# Patient Record
Sex: Male | Born: 1995 | Race: White | Hispanic: No | Marital: Single | Smoking: Never smoker
Health system: Southern US, Community
[De-identification: ages and names within clinical notes are randomized; demographics above are authoritative.]

## PROBLEM LIST (undated history)

## (undated) DIAGNOSIS — F209 Schizophrenia, unspecified: Secondary | ICD-10-CM

---
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
## 2018-03-31 ENCOUNTER — Ambulatory Visit (HOSPITAL_COMMUNITY)
Admission: RE | Admit: 2018-03-31 | Discharge: 2018-03-31 | Disposition: A | Discharge status: Home / Self Care | Payer: BLUE CROSS/BLUE SHIELD | Attending: Psychiatry | Admitting: Psychiatry

## 2018-03-31 DIAGNOSIS — F321 Major depressive disorder, single episode, moderate: Secondary | ICD-10-CM | POA: Diagnosis present

## 2018-03-31 NOTE — H&P (Signed)
Behavioral Health Medical Screening Exam  Francisco Murray is an 22 y.o. male.  Total Time spent with patient: 20 minutes  Psychiatric Specialty Exam: Physical Exam  Nursing note and vitals reviewed. Constitutional: He is oriented to person, place, and time. He appears well-developed and well-nourished.  Cardiovascular: Normal rate.  Respiratory: Effort normal.  Musculoskeletal: Normal range of motion.  Neurological: He is alert and oriented to person, place, and time.  Skin: Skin is warm.    Review of Systems  Constitutional: Negative.   HENT: Negative.   Eyes: Negative.   Respiratory: Negative.   Cardiovascular: Negative.   Gastrointestinal: Negative.   Genitourinary: Negative.   Musculoskeletal: Negative.   Skin: Negative.   Neurological: Negative.   Endo/Heme/Allergies: Negative.   Psychiatric/Behavioral: Negative for hallucinations and suicidal ideas.       Agitation and anger    Blood pressure (!) 144/71, pulse 78, resp. rate 16, SpO2 100 %.There is no height or weight on file to calculate BMI.  General Appearance: Casual  Eye Contact:  Good  Speech:  Clear and Coherent and Normal Rate  Volume:  Normal  Mood:  Depressed  Affect:  Flat  Thought Process:  Goal Directed and Descriptions of Associations: Intact  Orientation:  Full (Time, Place, and Person)  Thought Content:  WDL  Suicidal Thoughts:  No  Homicidal Thoughts:  No  Memory:  Immediate;   Good Recent;   Good Remote;   Good  Judgement:  Fair  Insight:  Good  Psychomotor Activity:  Normal  Concentration: Concentration: Good and Attention Span: Good  Recall:  Good  Fund of Knowledge:Good  Language: Good  Akathisia:  No  Handed:  Right  AIMS (if indicated):     Assets:  Communication Skills Desire for Improvement Financial Resources/Insurance Housing Physical Health Social Support Transportation Vocational/Educational  Sleep:       Musculoskeletal: Strength & Muscle Tone: within normal  limits Gait & Station: normal Patient leans: N/A  Blood pressure (!) 144/71, pulse 78, resp. rate 16, SpO2 100 %.  Recommendations: Patient given information for IOP and PHP Based on my evaluation the patient does not appear to have an emergency medical condition.  Gerlene Burdock Pippa Hanif, FNP 03/31/2018, 3:28 PM
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
## 2018-04-04 ENCOUNTER — Encounter (HOSPITAL_COMMUNITY): Payer: Self-pay | Admitting: Emergency Medicine

## 2018-04-04 ENCOUNTER — Emergency Department (HOSPITAL_COMMUNITY): Payer: BLUE CROSS/BLUE SHIELD

## 2018-04-04 ENCOUNTER — Emergency Department (HOSPITAL_COMMUNITY)
Admission: EM | Admit: 2018-04-04 | Discharge: 2018-04-05 | Disposition: A | Discharge status: Other Acute Inpatient Hospital | Payer: BLUE CROSS/BLUE SHIELD | Attending: Emergency Medicine | Admitting: Emergency Medicine

## 2018-04-04 DIAGNOSIS — R462 Strange and inexplicable behavior: Secondary | ICD-10-CM

## 2018-04-04 DIAGNOSIS — F209 Schizophrenia, unspecified: Secondary | ICD-10-CM | POA: Insufficient documentation

## 2018-04-04 DIAGNOSIS — Z008 Encounter for other general examination: Secondary | ICD-10-CM | POA: Diagnosis not present

## 2018-04-04 DIAGNOSIS — R4182 Altered mental status, unspecified: Secondary | ICD-10-CM | POA: Diagnosis present

## 2018-04-04 DIAGNOSIS — R443 Hallucinations, unspecified: Secondary | ICD-10-CM

## 2018-04-04 DIAGNOSIS — F2081 Schizophreniform disorder: Secondary | ICD-10-CM | POA: Diagnosis present

## 2018-04-04 LAB — URINALYSIS, COMPLETE (UACMP) WITH MICROSCOPIC
BACTERIA UA: NONE SEEN
Bilirubin Urine: NEGATIVE
GLUCOSE, UA: NEGATIVE mg/dL
Hgb urine dipstick: NEGATIVE
Ketones, ur: NEGATIVE mg/dL
Leukocytes, UA: NEGATIVE
NITRITE: NEGATIVE
PH: 5 (ref 5.0–8.0)
Protein, ur: NEGATIVE mg/dL
SPECIFIC GRAVITY, URINE: 1.029 (ref 1.005–1.030)

## 2018-04-04 LAB — CBC WITH DIFFERENTIAL/PLATELET
BASOS PCT: 0 %
Basophils Absolute: 0 10*3/uL (ref 0.0–0.1)
EOS PCT: 1 %
Eosinophils Absolute: 0.1 10*3/uL (ref 0.0–0.7)
HEMATOCRIT: 45.8 % (ref 39.0–52.0)
Hemoglobin: 15.8 g/dL (ref 13.0–17.0)
Lymphocytes Relative: 38 %
Lymphs Abs: 2.6 10*3/uL (ref 0.7–4.0)
MCH: 30.7 pg (ref 26.0–34.0)
MCHC: 34.5 g/dL (ref 30.0–36.0)
MCV: 88.9 fL (ref 78.0–100.0)
MONO ABS: 0.5 10*3/uL (ref 0.1–1.0)
MONOS PCT: 8 %
NEUTROS ABS: 3.7 10*3/uL (ref 1.7–7.7)
Neutrophils Relative %: 53 %
PLATELETS: 299 10*3/uL (ref 150–400)
RBC: 5.15 MIL/uL (ref 4.22–5.81)
RDW: 12.6 % (ref 11.5–15.5)
WBC: 6.8 10*3/uL (ref 4.0–10.5)

## 2018-04-04 LAB — ACETAMINOPHEN LEVEL: Acetaminophen (Tylenol), Serum: <10 ug/mL — ABNORMAL LOW (ref 10–30)

## 2018-04-04 LAB — COMPREHENSIVE METABOLIC PANEL
ALT: 20 U/L (ref 17–63)
AST: 17 U/L (ref 15–41)
Albumin: 4.2 g/dL (ref 3.5–5.0)
Alkaline Phosphatase: 66 U/L (ref 38–126)
Anion gap: 8 (ref 5–15)
BUN: 16 mg/dL (ref 6–20)
CO2: 25 mmol/L (ref 22–32)
Calcium: 10.1 mg/dL (ref 8.9–10.3)
Chloride: 109 mmol/L (ref 101–111)
Creatinine, Ser: 0.91 mg/dL (ref 0.61–1.24)
GFR calc Af Amer: >60 mL/min (ref >60)
GFR calc non Af Amer: >60 mL/min (ref >60)
Glucose, Bld: 102 mg/dL — ABNORMAL HIGH (ref 65–99)
Potassium: 4.1 mmol/L (ref 3.5–5.1)
Sodium: 142 mmol/L (ref 135–145)
Total Bilirubin: 1.4 mg/dL — ABNORMAL HIGH (ref 0.3–1.2)
Total Protein: 8.4 g/dL — ABNORMAL HIGH (ref 6.5–8.1)

## 2018-04-04 LAB — RAPID URINE DRUG SCREEN, HOSP PERFORMED
Amphetamines: NOT DETECTED
Barbiturates: NOT DETECTED
Benzodiazepines: NOT DETECTED
Cocaine: NOT DETECTED
Opiates: NOT DETECTED
Tetrahydrocannabinol: POSITIVE — AB

## 2018-04-04 LAB — SALICYLATE LEVEL: Salicylate Lvl: <7 mg/dL (ref 2.8–30.0)

## 2018-04-04 LAB — I-STAT CG4 LACTIC ACID, ED: LACTIC ACID, VENOUS: 1.51 mmol/L (ref 0.5–1.9)

## 2018-04-04 LAB — AMMONIA: Ammonia: 36 umol/L — ABNORMAL HIGH (ref 9–35)

## 2018-04-04 LAB — ETHANOL: Alcohol, Ethyl (B): <10 mg/dL (ref <10)

## 2018-04-04 MED ORDER — ZOLPIDEM TARTRATE 5 MG PO TABS: 5.0000 mg | ORAL_TABLET | Freq: Every evening | ORAL | Status: DC | PRN

## 2018-04-04 MED ORDER — ALUM & MAG HYDROXIDE-SIMETH 200-200-20 MG/5ML PO SUSP: 30.0000 mL | Freq: Four times a day (QID) | ORAL | Status: DC | PRN

## 2018-04-04 MED ORDER — IBUPROFEN 200 MG PO TABS: 600.0000 mg | ORAL_TABLET | Freq: Three times a day (TID) | ORAL | Status: DC | PRN

## 2018-04-04 MED ORDER — ONDANSETRON HCL 4 MG PO TABS: 4.0000 mg | ORAL_TABLET | Freq: Three times a day (TID) | ORAL | Status: DC | PRN

## 2018-04-04 NOTE — ED Notes (Signed)
Pt awake, alert & responsive, no distress noted, visiting with family at present. Calm & cooperative at present.  Monitoring for safety, Q 15 min checks in effect.  Pending TTS assessment.

## 2018-04-04 NOTE — ED Notes (Signed)
MOTHER/MICHELLE Brouillet/ [phone] FATHER/RANDY Giuliani/ [phone]

## 2018-04-04 NOTE — ED Notes (Signed)
Bed: [facility] Expected date:  Expected time:  Means of arrival:  Comments: Hold for room 6

## 2018-04-04 NOTE — ED Notes (Signed)
On admission to the Acute Unit pt is disorganized and unable to provide history. He cannot focus on a question. He wants his mother and/or sister with him.

## 2018-04-04 NOTE — ED Notes (Signed)
Called [facility] and they just got 2 pts and need 15-20 min before pt can come.

## 2018-04-04 NOTE — ED Provider Notes (Signed)
[place] DEPT Provider Note   CSN: [id number] Arrival date & time: 04/04/18  1308     History   Chief Complaint Chief Complaint  Patient presents with  . Medical Clearance  . Altered Mental Status    HPI Francisco Murray is a 22 y.o. male with no reported PMHx, who presents to the ED brought in by his mother due to concerns about his mental state.  LEVEL 5 CAVEAT DUE TO PSYCHIATRIC CONDITION/AMS/DIMINISHED RESPONSIVENESS, much of the information is provided by his mother and sister.  Patient's mother and sister state that he has been having bizarre behaviors, apologizing for things that he has not done, and possibly having hallucinations.  They state that they picked him up 5 days ago from [place] after he finished his semester, and he seemed depressed and not as responsive.  Over the weekend this progressed so they brought him here for evaluation.  Of note, chart review reveals that he went to [facility] voluntarily on Friday 03/31/18 and was assessed for irritability and anger; he did not meet inpatient criteria therefore he was discharged with outpatient follow up information.  Mother states that during the evaluation on Friday he was much more responsive and answering questions more readily, he has progressively become more withdrawn over the weekend.  He has to be asked several times before he will answer question, but he ultimately informs me that he is having visual hallucinations but he cannot describe those.  He states he is having "weird thoughts" and when asked whether he has any paranoid thoughts, he says yes.  He has not used any drugs or alcohol in about a month and a half, he previously used cocaine and possibly other drugs but he states he "does not want to talk about it" because "drugs are bad".  He states that he was very stressed at school both with his workload as well as his job, and thinks that could have contributed.  His family states that  he has not been sleeping well.  He denies SI or HI.  He is a non-smoker.  He denies any recent fevers, chest pain, shortness of breath, abdominal pain, nausea, vomiting, or any other medical complaints at this time, although his history and ROS are limited due to his decreased responsiveness.  His family denies being aware of any medical problems either currently or in the past.  He has never been diagnosed with anything psychiatric or otherwise.  He is not on any medications at home.  The history is provided by the patient, medical records, a parent and a relative. The history is limited by the condition of the patient. No language interpreter was used.    History reviewed. No pertinent past medical history.  There are no active problems to display for this patient.   History reviewed. No pertinent surgical history.      Home Medications    Prior to Admission medications   Not on File    Family History No family history on file.  Social History Social History   Tobacco Use  . Smoking status: Not on file  Substance Use Topics  . Alcohol use: Not on file  . Drug use: Not on file     Allergies   Patient has no allergy information on record.   Review of Systems Review of Systems  Unable to perform ROS: Other  Constitutional: Negative for fever.  Respiratory: Negative for shortness of breath.   Cardiovascular: Negative for chest pain.  Gastrointestinal: Negative for abdominal pain, nausea and vomiting.  Allergic/Immunologic: Negative for immunocompromised state.  Psychiatric/Behavioral: Positive for behavioral problems, hallucinations and sleep disturbance. Negative for suicidal ideas.   LEVEL 5 CAVEAT DUE TO PSYCHIATRIC CONDITION/AMS/DIMINISHED RESPONSIVENESS   Physical Exam Updated Vital Signs BP 132/76 (BP Location: Left Arm)   Pulse 72   Temp 98.5 F (36.9 C) (Oral)   Resp 17   SpO2 99%    Physical Exam  Constitutional: He is oriented to person, place, and  time. Vital signs are normal. He appears well-developed and well-nourished.  Non-toxic appearance. No distress.  Afebrile, nontoxic, NAD but stares off into the distance for much of the evaluation, not very conversant  HENT:  Head: Normocephalic and atraumatic.  Mouth/Throat: Oropharynx is clear and moist and mucous membranes are normal.  Eyes: Conjunctivae and EOM are normal. Right eye exhibits no discharge. Left eye exhibits no discharge.  Neck: Normal range of motion. Neck supple. No spinous process tenderness and no muscular tenderness present. No neck rigidity. Normal range of motion present.  FROM intact without spinous process TTP, no bony stepoffs or deformities, no paraspinous muscle TTP or muscle spasms. No rigidity or meningeal signs. No bruising or swelling.   Cardiovascular: Normal rate, regular rhythm, normal heart sounds and intact distal pulses. Exam reveals no gallop and no friction rub.  No murmur heard. Pulmonary/Chest: Effort normal and breath sounds normal. No respiratory distress. He has no decreased breath sounds. He has no wheezes. He has no rhonchi. He has no rales.  Abdominal: Soft. Normal appearance and bowel sounds are normal. He exhibits no distension. There is no tenderness. There is no rigidity, no rebound, no guarding, no CVA tenderness, no tenderness at McBurney's point and negative Murphy's sign.  Musculoskeletal: Normal range of motion.  MAE x4 Strength and sensation grossly intact in all extremities Distal pulses intact Gait steady  Neurological: He is alert and oriented to person, place, and time. He has normal strength. No sensory deficit. Gait normal. GCS eye subscore is 4. GCS verbal subscore is 5. GCS motor subscore is 6.  A&O x3 but has to be repeatedly asked the same question before finally answering, seems to get distracted and stares off into the distance between answering questions. When he does answer questions, it's coherent and doesn't seem confused.   No focal neuro deficits on limited exam Gait steady  Skin: Skin is warm, dry and intact. No rash noted.  Psychiatric: His affect is blunt. His speech is delayed. He is withdrawn and actively hallucinating. Thought content is paranoid. He expresses no homicidal and no suicidal ideation. He expresses no suicidal plans and no homicidal plans. He is noncommunicative.  He is very withdrawn, seems to get distracted, stares off into the distance during evaluation but when asked a question multiple times he will make eye contact and answer appropriately, then go back to staring. It takes multiple times of questioning for him to answer, he's fairly noncommunicative at times and his answers are delayed because he seems to be distracted. Very flat affect. Denies SI or HI, denies auditory hallucinations but reports visual hallucinations, seems to possibly be responding to internal stimuli but it's very hard to tell.  He is inattentive.  Nursing note and vitals reviewed.    ED Treatments / Results  Labs (all labs ordered are listed, but only abnormal results are displayed) Labs Reviewed  COMPREHENSIVE METABOLIC PANEL - Abnormal; Notable for the following components:      Result Value  Glucose, Bld 102 (*)    Total Protein 8.4 (*)    Total Bilirubin 1.4 (*)    All other components within normal limits  RAPID URINE DRUG SCREEN, HOSP PERFORMED - Abnormal; Notable for the following components:   Tetrahydrocannabinol POSITIVE (*)    All other components within normal limits  ACETAMINOPHEN LEVEL - Abnormal; Notable for the following components:   Acetaminophen (Tylenol), Serum <10 (*)    All other components within normal limits  AMMONIA - Abnormal; Notable for the following components:   Ammonia 36 (*)    All other components within normal limits  ETHANOL  SALICYLATE LEVEL  CBC WITH DIFFERENTIAL/PLATELET  URINALYSIS, COMPLETE (UACMP) WITH MICROSCOPIC  I-STAT CG4 LACTIC ACID, ED    EKG EKG  Interpretation  Date/Time:  Tuesday Apr 04 2018 14:22:49 EDT Ventricular Rate:  69 PR Interval:    QRS Duration: 81 QT Interval:  372 QTC Calculation: 399 R Axis:   75 Text Interpretation:  Sinus rhythm No old tracing to compare Confirmed by Virgel Manifold [phone]) on 04/04/2018 3:05:54 PM   Radiology Dg Chest 1 View  Result Date: 04/04/2018 CLINICAL DATA:  Altered mental status today. EXAM: CHEST  1 VIEW COMPARISON:  None. FINDINGS: Lungs are clear. Heart size is normal. No pneumothorax or pleural fluid. No bony abnormality. IMPRESSION: Negative chest. Electronically Signed   By: Inge Rise M.D.   On: 04/04/2018 14:51   Ct Head Wo Contrast  Result Date: 04/04/2018 CLINICAL DATA:  Altered level of consciousness. EXAM: CT HEAD WITHOUT CONTRAST TECHNIQUE: Contiguous axial images were obtained from the base of the skull through the vertex without intravenous contrast. COMPARISON:  CT head 08/07/2015 FINDINGS: Brain: No evidence of acute infarction, hemorrhage, hydrocephalus, extra-axial collection or mass lesion/mass effect. Vascular: No hyperdense vessel or unexpected calcification. Skull: Negative Sinuses/Orbits: Negative Other: None IMPRESSION: Negative CT head Electronically Signed   By: Franchot Gallo M.D.   On: 04/04/2018 15:03    Procedures Procedures (including critical care time)  Medications Ordered in ED Medications  ibuprofen (ADVIL,MOTRIN) tablet 600 mg (has no administration in time range)  zolpidem (AMBIEN) tablet 5 mg (has no administration in time range)  ondansetron (ZOFRAN) tablet 4 mg (has no administration in time range)  alum & mag hydroxide-simeth (MAALOX/MYLANTA) 200-200-20 MG/5ML suspension 30 mL (has no administration in time range)     Initial Impression / Assessment and Plan / ED Course  I have reviewed the triage vital signs and the nursing notes.  Pertinent labs & imaging results that were available during my care of the patient were reviewed by me  and considered in my medical decision making (see chart for details).     22 y.o. male here with bizarre behaviors per family; level 5 caveat applies due to decreased responsiveness. He will answer questions but then appears to get distracted and stares off into the distance for long periods of time.  He reports having weird thoughts including paranoid thoughts, as well as visual hallucinations but he cannot fully describe this.  He has not been sleeping well, and states that he was stressed at school, the semester just finished prior to him returning home.  He was assessed on 03/31/2018 by behavioral health who did not feel that he met inpatient criteria, according to mother he was not acting this way at that time and he was more responsive to them.  Over the weekend he has become more withdrawn so they brought him back for repeat evaluation.  On  exam, he is alert and oriented x3 although he has to be repeatedly asked the same question before he will respond, he seems very distracted and possibly responding to internal stimuli although it hard to tell.  No meningismus, clear lung sounds, no abdominal tenderness, physical exam is fairly benign.  We will get labs, CT head, chest x-ray, EKG, urine, and then reassess.  This may be psychological in nature, but we will rule out medical condition prior to assuming that.  Will reassess shortly.  4:40 PM CBC w/diff WNL. CMP with marginally elevated Tbili 1.4 but otherwise unremarkable. Ammonia barely over the cut off of normal at 36 (cut off is 35). Lactic WNL. EtOH level undetectable. Salicylate and acetaminophen levels WNL. U/A unremarkable. UDS with +THC but otherwise unremarkable. EKG unremarkable. CT head negative. CXR negative.  Medically cleared at this point, I find no organic/medical cause of his issues today, and I suspect psychiatric condition causing his symptoms/condition today. Will get TTS consultation. Discussed case with my attending Dr. Wilson Singer who  agrees with plan. Psych hold orders placed. Please see TTS notes for further documentation of care/dispo. PLEASE NOTE THAT PT IS HERE VOLUNTARILY AT THIS TIME, IF PT TRIES TO LEAVE THEY WOULD NEED REASSESSMENT TO SEE IF IVC PAPERWORK NEEDED TO BE TAKEN OUT. Pt stable at time of med clearance.     Final Clinical Impressions(s) / ED Diagnoses   Final diagnoses:  Bizarre behavior  Hallucinations  Medical clearance for psychiatric admission    ED Discharge Orders    [address], [place], [place] 04/04/18 1641    Virgel Manifold, MD 04/05/18 1223

## 2018-04-04 NOTE — BH Assessment (Addendum)
Assessment Note  Francisco Murray is an 22 y.o. male, who presents voluntary and accompanied by his father to [facility]. Clinician asked the pt, "what brought you to the hospital?" Pt was poor historian during the assessment. Per chart, pt was assessed at [facility] as a walk-in on 03/31/2018 and presented with depression and SI with no pan. During the assessment the pt would began talking then stop, sit, and stare. Pt needed to be reprompted to re-engage in the assessment. Pt reported, "sick, I'm sick." Pt reported, he feels gulty. Pt reported, "feeling unnormal," changing for the good. Pt reported, listening to his conscience, God talks to him and needs to be released. 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 Pt could not answer questions regarding the love of his life. Pt reported, "I am holy man." Pt reported, access to a rifle under his bed in [place], [place].    Clinician was unable to assess the following: "SI, HI, AVH."   Pt denies abuse. Pt reported, smoking marijuana about a month or so ago. Pt denies being linked to OPT resources (medication management and/or counseling.) Pt denies, previous inpatient admissions.   Pt presents quiet/awake in scrubs with word salad speech. Pt's mood was guilty. Pt's affect was flat. Pt's thought process was flight of ideas. Pt's judgement was impaired. Pt was oriented x1. Pt's concentration and insight are poor. Pt's impulse control was fair. Pt reported, if discharged from [facility] he could contract for safety. Pt's father reported, he would feel safe if the pt was discharged from [facility].   Diagnosis: Schizophrenia.  Past Medical History: History reviewed. No pertinent past medical history.  History reviewed. No pertinent surgical history.  Family History: No family history on file.  Social History:  reports that he has never smoked. He has never used smokeless tobacco. He reports that he does not drink alcohol or use drugs.  Additional Social  History:  Alcohol / Drug Use Pain Medications: See MAR Prescriptions: See MAR Over the Counter: See MAR History of alcohol / drug use?: Yes Substance #1 Name of Substance 1: Marijuana.  1 - Age of First Use: UTA. 1 - Amount (size/oz): Pt reported, smoking about a month or so ago. 1 - Frequency: UTA 1 - Duration: UTA 1 - Last Use / Amount: Pt reported, about a month or so ago.  CIWA: CIWA-Ar BP: (!) 145/73 Pulse Rate: 78 COWS:    Allergies: No Known Allergies  Home Medications:  (Not in a hospital admission)  OB/GYN Status:  No LMP for male patient.  General Assessment Data [facility] Assessment: In system Is this a Tele or Face-to-Face Assessment?: Face-to-Face Is this an Initial Assessment or a Re-assessment for this encounter?: Initial Assessment Marital status: Single Living Arrangements: Other (Comment)(Room mates. ) Can pt return to current living arrangement?: Yes Admission Status: Voluntary Is patient capable of signing voluntary admission?: Yes Referral Source: Self/Family/Friend Insurance type: BCBS.     Crisis Care Plan Living Arrangements: Other (Comment)(Room mates. ) Legal Guardian: Other:(Self. ) Name of Psychiatrist: NA Name of Therapist: NA  Education Status Is patient currently in school?: Yes Current Grade: Junior in college.  Highest grade of school patient has completed: Sophomore in college.  Name of school: [employer] Contact person: NA  Risk to self with the past 6 months Suicidal Ideation: (UTA) Has patient been a risk to self within the past 6 months prior to admission? : Other (  comment)(UTA) Suicidal Intent: (UTA) Has patient had any suicidal intent within the past 6 months prior to admission? : (UTA) Is patient at risk for suicide?: (UTA) Suicidal Plan?: (UTA) Has patient had any suicidal plan within the past 6 months prior to admission? : (UTA) Access to Means: (UTA) What has been your use of  drugs/alcohol within the last 12 months?: Marijuana.  Previous Attempts/Gestures: No How many times?: 0 Other Self Harm Risks: Pt denies.  Triggers for Past Attempts: Other (Comment)(NA) Intentional Self Injurious Behavior: None(Pt denies. ) Family Suicide History: Unable to assess Recent stressful life event(s): Other (Comment)(Pt reported, God was in trouble, feeling "unnormal. ") Persecutory voices/beliefs?: No Depression: Yes Depression Symptoms: Guilt Substance abuse history and/or treatment for substance abuse?: No Suicide prevention information given to non-admitted patients: Not applicable  Risk to Others within the past 6 months Homicidal Ideation: (UTA) Does patient have any lifetime risk of violence toward others beyond the six months prior to admission? : (UTA) Thoughts of Harm to Others: (UTA) Current Homicidal Intent: (UTA) Current Homicidal Plan: (UTA) Access to Homicidal Means: (UTA) Identified Victim: UTA History of harm to others?: (UTA) Assessment of Violence: (UTA) Violent Behavior Description: UTA Does patient have access to weapons?: Yes (Comment)(Rifle. ) Criminal Charges Pending?: No Does patient have a court date: No Is patient on probation?: No  Psychosis Hallucinations: Visual(Per chart.) Delusions: Unspecified  Mental Status Report Appearance/Hygiene: In scrubs Eye Contact: Poor Motor Activity: Unremarkable Speech: Word salad Level of Consciousness: Quiet/awake Mood: Guilty Affect: Flat Anxiety Level: Minimal Thought Processes: Flight of Ideas Judgement: Impaired Orientation: Person Obsessive Compulsive Thoughts/Behaviors: Unable to Assess  Cognitive Functioning Concentration: Poor Memory: Recent Impaired, Remote Impaired Is patient IDD: No Is patient DD?: No Insight: Poor Impulse Control: Fair Appetite: Good Sleep: Decreased Total Hours of Sleep: (Pt reported, not sleeping in a month. ) Vegetative Symptoms: Unable to  Assess  ADLScreening [facility]) Patient's cognitive ability adequate to safely complete daily activities?: Yes Patient able to express need for assistance with ADLs?: Yes Independently performs ADLs?: Yes (appropriate for developmental age)  Prior Inpatient Therapy Prior Inpatient Therapy: No  Prior Outpatient Therapy Prior Outpatient Therapy: No Does patient have an ACCT team?: No Does patient have Intensive In-House Services?  : No Does patient have [facility] services? : No Does patient have P4CC services?: No  ADL Screening (condition at time of admission) Patient's cognitive ability adequate to safely complete daily activities?: Yes Is the patient deaf or have difficulty hearing?: No Does the patient have difficulty seeing, even when wearing glasses/contacts?: No Does the patient have difficulty concentrating, remembering, or making decisions?: Yes Patient able to express need for assistance with ADLs?: Yes Does the patient have difficulty dressing or bathing?: No Independently performs ADLs?: Yes (appropriate for developmental age) Does the patient have difficulty walking or climbing stairs?: No Weakness of Legs: None Weakness of Arms/Hands: None  Home Assistive Devices/Equipment Home Assistive Devices/Equipment: None    Abuse/Neglect Assessment (Assessment to be complete while patient is alone) Abuse/Neglect Assessment Can Be Completed: Yes Physical Abuse: Denies(Pt denies. ) Verbal Abuse: Denies(Pt denies. ) Sexual Abuse: Denies(Pt denies, ) Exploitation of patient/patient's resources: Denies(Pt denies. ) Self-Neglect: Denies(Pt denies. )     Advance Directives (For Healthcare) Does Patient Have a Medical Advance Directive?: No    Additional Information 1:1 In Past 12 Months?: No CIRT Risk: No Elopement Risk: No Does patient have medical clearance?: No     Disposition: Donell Sievert, PA recommends inpatient treatment. Disposition  discussed  with France Ravens, PA and Joanie Coddington, Charity fundraiser. TTS to seek placement.   Disposition Initial Assessment Completed for this Encounter: Yes Disposition of Patient: (inpatient treatment. ) Patient refused recommended treatment: No Mode of transportation if patient is discharged?: N/A  On Site Evaluation by:  Redmond Pulling MS, LPC, CRC Reviewed with Physician:  France Ravens, PA and Donell Sievert, PA  Caprice Renshaw D Shani Fitch 04/04/2018 10:08 PM   Redmond Pulling, MS, [facility], [facility] Triage Specialist [phone]

## 2018-04-04 NOTE — ED Triage Notes (Addendum)
Patient brought in by family with complaints of bizarre behavior, altered mental status. Started seeing therapist on Friday and has progressively gotten worse. Struggles to answer questions. Reports that he has not done any drugs in 2 months. Hx of cocaine. Denies SI/HI.

## 2018-04-04 NOTE — ED Notes (Signed)
Bed: [facility] Expected date:  Expected time:  Means of arrival:  Comments: 

## 2018-04-05 ENCOUNTER — Other Ambulatory Visit: Payer: Self-pay

## 2018-04-05 ENCOUNTER — Inpatient Hospital Stay (HOSPITAL_COMMUNITY)
Admission: AD | Admit: 2018-04-05 | Discharge: 2018-04-12 | DRG: 885 | Disposition: A | Discharge status: Home / Self Care | Payer: BLUE CROSS/BLUE SHIELD | Source: Intra-hospital | Attending: Psychiatry | Admitting: Psychiatry

## 2018-04-05 ENCOUNTER — Encounter (HOSPITAL_COMMUNITY): Payer: Self-pay | Admitting: Nurse Practitioner

## 2018-04-05 DIAGNOSIS — F29 Unspecified psychosis not due to a substance or known physiological condition: Secondary | ICD-10-CM

## 2018-04-05 DIAGNOSIS — F209 Schizophrenia, unspecified: Secondary | ICD-10-CM | POA: Diagnosis not present

## 2018-04-05 DIAGNOSIS — F2081 Schizophreniform disorder: Secondary | ICD-10-CM | POA: Diagnosis present

## 2018-04-05 DIAGNOSIS — F333 Major depressive disorder, recurrent, severe with psychotic symptoms: Principal | ICD-10-CM | POA: Diagnosis present

## 2018-04-05 DIAGNOSIS — R45851 Suicidal ideations: Secondary | ICD-10-CM

## 2018-04-05 DIAGNOSIS — R45850 Homicidal ideations: Secondary | ICD-10-CM

## 2018-04-05 DIAGNOSIS — G4700 Insomnia, unspecified: Secondary | ICD-10-CM | POA: Diagnosis present

## 2018-04-05 DIAGNOSIS — F1490 Cocaine use, unspecified, uncomplicated: Secondary | ICD-10-CM | POA: Diagnosis not present

## 2018-04-05 DIAGNOSIS — F419 Anxiety disorder, unspecified: Secondary | ICD-10-CM | POA: Diagnosis present

## 2018-04-05 DIAGNOSIS — R450 Nervousness: Secondary | ICD-10-CM | POA: Diagnosis not present

## 2018-04-05 DIAGNOSIS — F28 Other psychotic disorder not due to a substance or known physiological condition: Secondary | ICD-10-CM | POA: Diagnosis not present

## 2018-04-05 DIAGNOSIS — F1390 Sedative, hypnotic, or anxiolytic use, unspecified, uncomplicated: Secondary | ICD-10-CM | POA: Diagnosis not present

## 2018-04-05 DIAGNOSIS — R451 Restlessness and agitation: Secondary | ICD-10-CM | POA: Diagnosis not present

## 2018-04-05 DIAGNOSIS — F329 Major depressive disorder, single episode, unspecified: Secondary | ICD-10-CM

## 2018-04-05 DIAGNOSIS — F1290 Cannabis use, unspecified, uncomplicated: Secondary | ICD-10-CM | POA: Diagnosis not present

## 2018-04-05 DIAGNOSIS — Z79899 Other long term (current) drug therapy: Secondary | ICD-10-CM | POA: Diagnosis not present

## 2018-04-05 DIAGNOSIS — R4587 Impulsiveness: Secondary | ICD-10-CM

## 2018-04-05 HISTORY — DX: Schizophrenia, unspecified: F20.9

## 2018-04-05 MED ORDER — RISPERIDONE 0.5 MG PO TABS
0.5000 mg | ORAL_TABLET | Freq: Once | ORAL | Status: AC
  Administered 2018-04-05: 0.5 mg via ORAL
  Filled 2018-04-05: qty 1

## 2018-04-05 MED ORDER — ZIPRASIDONE MESYLATE 20 MG IM SOLR
20.0000 mg | Freq: Two times a day (BID) | INTRAMUSCULAR | Status: DC | PRN
  Administered 2018-04-05: 20 mg via INTRAMUSCULAR
  Filled 2018-04-05: qty 20

## 2018-04-05 MED ORDER — ACETAMINOPHEN 325 MG PO TABS: 650.0000 mg | ORAL_TABLET | Freq: Four times a day (QID) | ORAL | Status: DC | PRN

## 2018-04-05 MED ORDER — LORAZEPAM 2 MG/ML IJ SOLN
2.0000 mg | Freq: Once | INTRAMUSCULAR | Status: AC
  Administered 2018-04-05: 2 mg via INTRAMUSCULAR
  Filled 2018-04-05: qty 1

## 2018-04-05 MED ORDER — TRAZODONE HCL 50 MG PO TABS
50.0000 mg | ORAL_TABLET | Freq: Every evening | ORAL | Status: DC | PRN
  Administered 2018-04-07 – 2018-04-11 (×4): 50 mg via ORAL
  Filled 2018-04-05 (×5): qty 1

## 2018-04-05 MED ORDER — DIPHENHYDRAMINE HCL 50 MG/ML IJ SOLN
50.0000 mg | Freq: Once | INTRAMUSCULAR | Status: AC
  Administered 2018-04-05: 50 mg via INTRAMUSCULAR
  Filled 2018-04-05 (×2): qty 1

## 2018-04-05 MED ORDER — CHLORPROMAZINE HCL 25 MG/ML IJ SOLN
50.0000 mg | Freq: Once | INTRAMUSCULAR | Status: AC
  Administered 2018-04-05: 50 mg via INTRAMUSCULAR
  Filled 2018-04-05 (×2): qty 2

## 2018-04-05 MED ORDER — MAGNESIUM HYDROXIDE 400 MG/5ML PO SUSP: 30.0000 mL | Freq: Every day | ORAL | Status: DC | PRN

## 2018-04-05 MED ORDER — ZIPRASIDONE MESYLATE 20 MG IM SOLR
20.0000 mg | Freq: Once | INTRAMUSCULAR | Status: AC
  Administered 2018-04-05: 20 mg via INTRAMUSCULAR
  Filled 2018-04-05: qty 20

## 2018-04-05 MED ORDER — LORAZEPAM 2 MG/ML IJ SOLN
1.0000 mg | Freq: Once | INTRAMUSCULAR | Status: AC | PRN
  Administered 2018-04-05: 1 mg via INTRAMUSCULAR
  Filled 2018-04-05: qty 1

## 2018-04-05 MED ORDER — DIPHENHYDRAMINE HCL 50 MG/ML IJ SOLN
50.0000 mg | Freq: Once | INTRAMUSCULAR | Status: AC
  Administered 2018-04-05: 50 mg via INTRAMUSCULAR
  Filled 2018-04-05: qty 1

## 2018-04-05 MED ORDER — HYDROXYZINE HCL 25 MG PO TABS
25.0000 mg | ORAL_TABLET | Freq: Three times a day (TID) | ORAL | Status: DC | PRN
  Filled 2018-04-05 (×2): qty 1

## 2018-04-05 MED ORDER — LORAZEPAM 2 MG/ML IJ SOLN
INTRAMUSCULAR | Status: AC
  Filled 2018-04-05: qty 1

## 2018-04-05 MED ORDER — LORAZEPAM 1 MG PO TABS
1.0000 mg | ORAL_TABLET | Freq: Once | ORAL | Status: DC
  Filled 2018-04-05: qty 1

## 2018-04-05 MED ORDER — LORAZEPAM 1 MG PO TABS
1.0000 mg | ORAL_TABLET | ORAL | Status: DC | PRN
  Filled 2018-04-05: qty 1

## 2018-04-05 MED ORDER — LORAZEPAM 2 MG/ML IJ SOLN
1.0000 mg | Freq: Four times a day (QID) | INTRAMUSCULAR | Status: DC | PRN
  Administered 2018-04-05: 1 mg via INTRAMUSCULAR

## 2018-04-05 MED ORDER — ALUM & MAG HYDROXIDE-SIMETH 200-200-20 MG/5ML PO SUSP
30.0000 mL | ORAL | Status: DC | PRN
Start: 2018-04-05 — End: 2018-04-12

## 2018-04-05 NOTE — Progress Notes (Signed)
Pt continues to bang on the window, when told he could not do that pt responded" why" .

## 2018-04-05 NOTE — ED Notes (Addendum)
Pt wandering in other pts rooms, entering the occupied bathrooms and attempting to exit the doors.  PA Spencer called for medication orders.  Security at bedside to assist with medication administration, pt cooperative.

## 2018-04-05 NOTE — ED Notes (Signed)
Pt transported to [facility] by [employer]. Pt's parents had taken his clothing home already. Pt was calm and cooperative.

## 2018-04-05 NOTE — ED Notes (Signed)
Pt has been in bed with eyes closed, sleeping some and alert at other times.  Tried to participate when Dr. Sharma Covert was in the room this morning, but appeared to be suffering from disorganized thoughts and thought blocking.

## 2018-04-05 NOTE — Progress Notes (Signed)
Pt continues to try to bang on the window trying to break it

## 2018-04-05 NOTE — ED Notes (Signed)
Report given to [facility] at [facility] and [employer] called.

## 2018-04-05 NOTE — Progress Notes (Signed)
Patient attempted to elope from unit.  Patient was redirected by and was told he could not leave the unit.  Patient responded to staff "suck my dick I am getting ready to leave."  Patient then turned to another staff member and "kiss my ass I am going to leave."  Patient then stated I am going to get out of here and began banging on the door.  Patient attempted to leave off of the unit several times.  Patient was offered prn medications by the nurse do to agitation and patient refused stating we were trying to poison him and continued to escalate with pacing and clenching his fists as he paced.  Patient went into the day room and began doing pushups stating "I am ready I am ready!"  Patient then went into the hallway and staff attempted to de-escalate patient.  Patient continued to threaten staff.  Staff approached patient do to continued escalation and patient attempted to strike staff.  Patient was placed in a manual hold at 1750 and escorted to his room.  IM medications administered and patient was held until 1800 when he was able to calm and cease all agitated and aggressive behaviors.

## 2018-04-05 NOTE — Consult Note (Addendum)
[facility] Face-to-Face Psychiatry Consult   Reason for Consult:  Psychosis Referring Physician:  EDP Patient Identification: Francisco Murray MRN:  [id number] Principal Diagnosis: Psychosis ([place]) Diagnosis:  There are no active problems to display for this patient.   Total Time spent with patient: 45 minutes  Subjective:   Francisco Murray is a 22 y.o. male patient admitted with acute psychosis.  HPI:  Pt was seen and chart reviewed with treatment team and Dr Mariea Clonts. Pt was admitted from home by his mother and sister because he was acting very bizarre. He presented to [facility] as a walk-in on Friday and was assessed for anger and irritability and was responsive but did not meet criteria for inpatient admission. Since Friday he has decompensated and is hallucinating and responding to internal stimuli. Pt stated he has suicidal and homicidal ideation. Pt has had poor sleep and poor apetitie. Pt's UDS positive for THC and BAL negative. Pt would benefit from an inpatient psychiatric admission for crisis stabilization and medication management.   Past Psychiatric History: As above  Risk to Self: Suicidal Ideation: (UTA) Suicidal Intent: (UTA) Is patient at risk for suicide?: (UTA) Suicidal Plan?: (UTA) Access to Means: (UTA) What has been your use of drugs/alcohol within the last 12 months?: Marijuana.  How many times?: 0 Other Self Harm Risks: Pt denies.  Triggers for Past Attempts: Other (Comment)(NA) Intentional Self Injurious Behavior: None(Pt denies. ) Risk to Others: Homicidal Ideation: (UTA) Thoughts of Harm to Others: (UTA) Current Homicidal Intent: (UTA) Current Homicidal Plan: (UTA) Access to Homicidal Means: (UTA) Identified Victim: UTA History of harm to others?: (UTA) Assessment of Violence: (UTA) Violent Behavior Description: UTA Does patient have access to weapons?: Yes (Comment)(Rifle. ) Criminal Charges Pending?: No Does patient have a court date: No Prior Inpatient Therapy: Prior  Inpatient Therapy: No Prior Outpatient Therapy: Prior Outpatient Therapy: No Does patient have an ACCT team?: No Does patient have Intensive In-House Services?  : No Does patient have [facility] services? : No Does patient have P4CC services?: No  Past Medical History: History reviewed. No pertinent past medical history. History reviewed. No pertinent surgical history. Family History: No family history on file. Family Psychiatric  History: Unknown  Social History:  Social History   Substance and Sexual Activity  Alcohol Use Never  . Frequency: Never     Social History   Substance and Sexual Activity  Drug Use Never    Social History   Socioeconomic History  . Marital status: Single    Spouse name: Not on file  . Number of children: Not on file  . Years of education: Not on file  . Highest education level: Not on file  Occupational History  . Not on file  Social Needs  . Financial resource strain: Not on file  . Food insecurity:    Worry: Not on file    Inability: Not on file  . Transportation needs:    Medical: Not on file    Non-medical: Not on file  Tobacco Use  . Smoking status: Never Smoker  . Smokeless tobacco: Never Used  Substance and Sexual Activity  . Alcohol use: Never    Frequency: Never  . Drug use: Never  . Sexual activity: Not on file  Lifestyle  . Physical activity:    Days per week: Not on file    Minutes per session: Not on file  . Stress: Not on file  Relationships  . Social connections:    Talks on phone: Not on file  Gets together: Not on file    Attends religious service: Not on file    Active member of club or organization: Not on file    Attends meetings of clubs or organizations: Not on file    Relationship status: Not on file  Other Topics Concern  . Not on file  Social History Narrative  . Not on file   Additional Social History: He lives at home with his mother and father. He works in [employer].     Allergies:  No Known  Allergies  Labs:  Results for orders placed or performed during the hospital encounter of 04/04/18 (from the past 48 hour(s))  Comprehensive metabolic panel     Status: Abnormal   Collection Time: 04/04/18  2:16 PM  Result Value Ref Range   Sodium 142 135 - 145 mmol/L   Potassium 4.1 3.5 - 5.1 mmol/L   Chloride 109 101 - 111 mmol/L   CO2 25 22 - 32 mmol/L   Glucose, Bld 102 (H) 65 - 99 mg/dL   BUN 16 6 - 20 mg/dL   Creatinine, Ser 0.91 0.61 - 1.24 mg/dL   Calcium 10.1 8.9 - 10.3 mg/dL   Total Protein 8.4 (H) 6.5 - 8.1 g/dL   Albumin 4.2 3.5 - 5.0 g/dL   AST 17 15 - 41 U/L   ALT 20 17 - 63 U/L   Alkaline Phosphatase 66 38 - 126 U/L   Total Bilirubin 1.4 (H) 0.3 - 1.2 mg/dL   GFR calc non Af Amer >60 >60 mL/min   GFR calc Af Amer >60 >60 mL/min    Comment: (NOTE) The eGFR has been calculated using the CKD EPI equation. This calculation has not been validated in all clinical situations. eGFR's persistently <60 mL/min signify possible Chronic Kidney Disease.    Anion gap 8 5 - 15    Comment: Performed at [facility], [place] [address] [place], [place] [address]  Ethanol     Status: None   Collection Time: 04/04/18  2:16 PM  Result Value Ref Range   Alcohol, Ethyl (B) <10 <10 mg/dL    Comment: (NOTE) Lowest detectable limit for serum alcohol is 10 mg/dL. For medical purposes only. Performed at [facility], [place] [address], [place], [place] [address]   Salicylate level     Status: None   Collection Time: 04/04/18  2:16 PM  Result Value Ref Range   Salicylate Lvl <2.1 2.8 - 30.0 mg/dL    Comment: Performed at [facility], [place] [address], [place], [place] [address]  Acetaminophen level     Status: Abnormal   Collection Time: 04/04/18  2:16 PM  Result Value Ref Range   Acetaminophen (Tylenol), Serum <10 (L) 10 - 30 ug/mL    Comment: (NOTE) Therapeutic concentrations vary significantly. A range of 10-30 ug/mL  may be  an effective concentration for many patients. However, some  are best treated at concentrations outside of this range. Acetaminophen concentrations >150 ug/mL at 4 hours after ingestion  and >50 ug/mL at 12 hours after ingestion are often associated with  toxic reactions. Performed at [facility], [place] [address], [place], [place] [address]   CBC with Differential     Status: None   Collection Time: 04/04/18  2:16 PM  Result Value Ref Range   WBC 6.8 4.0 - 10.5 K/uL   RBC 5.15 4.22 - 5.81 MIL/uL   Hemoglobin 15.8 13.0 - 17.0 g/dL  HCT 45.8 39.0 - 52.0 %   MCV 88.9 78.0 - 100.0 fL   MCH 30.7 26.0 - 34.0 pg   MCHC 34.5 30.0 - 36.0 g/dL   RDW 12.6 11.5 - 15.5 %   Platelets 299 150 - 400 K/uL   Neutrophils Relative % 53 %   Neutro Abs 3.7 1.7 - 7.7 K/uL   Lymphocytes Relative 38 %   Lymphs Abs 2.6 0.7 - 4.0 K/uL   Monocytes Relative 8 %   Monocytes Absolute 0.5 0.1 - 1.0 K/uL   Eosinophils Relative 1 %   Eosinophils Absolute 0.1 0.0 - 0.7 K/uL   Basophils Relative 0 %   Basophils Absolute 0.0 0.0 - 0.1 K/uL    Comment: Performed at [facility], [place] [address], [place], [place] [address]  Ammonia     Status: Abnormal   Collection Time: 04/04/18  2:16 PM  Result Value Ref Range   Ammonia 36 (H) 9 - 35 umol/L    Comment: Performed at [facility], [place] [address], [place], [place] [address]  I-Stat CG4 Lactic Acid, ED     Status: None   Collection Time: 04/04/18  2:21 PM  Result Value Ref Range   Lactic Acid, Venous 1.51 0.5 - 1.9 mmol/L  Rapid urine drug screen (hospital performed)     Status: Abnormal   Collection Time: 04/04/18  3:45 PM  Result Value Ref Range   Opiates NONE DETECTED NONE DETECTED   Cocaine NONE DETECTED NONE DETECTED   Benzodiazepines NONE DETECTED NONE DETECTED   Amphetamines NONE DETECTED NONE DETECTED   Tetrahydrocannabinol POSITIVE (A) NONE DETECTED   Barbiturates NONE DETECTED NONE DETECTED     Comment: (NOTE) DRUG SCREEN FOR MEDICAL PURPOSES ONLY.  IF CONFIRMATION IS NEEDED FOR ANY PURPOSE, NOTIFY LAB WITHIN 5 DAYS. LOWEST DETECTABLE LIMITS FOR URINE DRUG SCREEN Drug Class                     Cutoff (ng/mL) Amphetamine and metabolites    1000 Barbiturate and metabolites    200 Benzodiazepine                 947 Tricyclics and metabolites     300 Opiates and metabolites        300 Cocaine and metabolites        300 THC                            50 Performed at [facility], [place] [address], [place], [place] [address]   Urinalysis, Complete w Microscopic     Status: None   Collection Time: 04/04/18  3:45 PM  Result Value Ref Range   Color, Urine YELLOW YELLOW   APPearance CLEAR CLEAR   Specific Gravity, Urine 1.029 1.005 - 1.030   pH 5.0 5.0 - 8.0   Glucose, UA NEGATIVE NEGATIVE mg/dL   Hgb urine dipstick NEGATIVE NEGATIVE   Bilirubin Urine NEGATIVE NEGATIVE   Ketones, ur NEGATIVE NEGATIVE mg/dL   Protein, ur NEGATIVE NEGATIVE mg/dL   Nitrite NEGATIVE NEGATIVE   Leukocytes, UA NEGATIVE NEGATIVE   RBC / HPF 0-5 0 - 5 RBC/hpf   WBC, UA 0-5 0 - 5 WBC/hpf   Bacteria, UA NONE SEEN NONE SEEN   Mucus PRESENT     Comment: Performed at [facility] [place] [facility], [place] [address] [place], [place] [address]    Current Facility-Administered  Medications  Medication Dose Route Frequency Provider Last Rate Last Dose  . alum & mag hydroxide-simeth (MAALOX/MYLANTA) 200-200-20 MG/5ML suspension 30 mL  30 mL Oral Q6H PRN Street, [place], [place]      . ibuprofen (ADVIL,MOTRIN) tablet 600 mg  600 mg Oral Q8H PRN Street, [place], [place]      . LORazepam (ATIVAN) tablet 1 mg  1 mg Oral Once Ethelene Hal, NP      . ondansetron [facility]) tablet 4 mg  4 mg Oral Q8H PRN Street, [place], [place]      . zolpidem (AMBIEN) tablet 5 mg  5 mg Oral QHS PRN Street, [place], [place]       No current outpatient medications on file.    Musculoskeletal: Strength &  Muscle Tone: within normal limits Gait & Station: normal Patient leans: N/A  Psychiatric Specialty Exam: Physical Exam  Nursing note and vitals reviewed. Constitutional: He appears well-developed and well-nourished.  HENT:  Head: Normocephalic.  Neck: Normal range of motion.  Respiratory: Effort normal.  Musculoskeletal: Normal range of motion.  Neurological: He is alert.  Psychiatric: His speech is delayed. He is slowed, withdrawn and actively hallucinating. Thought content is paranoid and delusional. Cognition and memory are impaired. He expresses impulsivity. He exhibits a depressed mood.    Review of Systems  Psychiatric/Behavioral: Positive for depression and hallucinations. Negative for memory loss, substance abuse and suicidal ideas. The patient is not nervous/anxious and does not have insomnia.   All other systems reviewed and are negative.   Blood pressure (!) 145/73, pulse 78, temperature 98.4 F (36.9 C), temperature source Oral, resp. rate 16, SpO2 100 %.There is no height or weight on file to calculate BMI.  General Appearance: Casual  Eye Contact:  Fair  Speech:  Blocked  Volume:  Decreased  Mood:  Depressed  Affect:  Congruent  Thought Process:  Disorganized  Orientation:  Other:  person  Thought Content:  Illogical, Hallucinations: Auditory Visual and Paranoid Ideation  Suicidal Thoughts:  Yes.  without intent/plan  Homicidal Thoughts:  Yes.  without intent/plan  Memory:  Immediate;   Fair Recent;   Fair Remote;   Fair  Judgement:  Poor  Insight:  Shallow  Psychomotor Activity:  Decreased  Concentration:  Concentration: Fair and Attention Span: Fair  Recall:  [employer] of Knowledge:  Good  Language:  Good  Akathisia:  No  Handed:  Right  AIMS (if indicated):   N/A  Assets:  Financial Resources/Insurance Housing Social Support  ADL's:  Intact  Cognition:  Impaired,  Mild  Sleep:   N/A     Treatment Plan Summary: Daily contact with patient to  assess and evaluate symptoms and progress in treatment and Medication management (see MAR )  Disposition: Recommend psychiatric Inpatient admission when medically cleared. TTS to seek placement  Ethelene Hal, NP 04/05/2018 2:36 PM   Patient seen face-to-face for psychiatric evaluation, chart reviewed and case discussed with the physician extender and developed treatment plan. Reviewed the information documented and agree with the treatment plan.  Buford Dresser, DO 04/05/18 6:10 PM

## 2018-04-05 NOTE — Progress Notes (Signed)
Did not attend group 

## 2018-04-05 NOTE — ED Notes (Signed)
Pt presented with extreme anxiety, not responding verbally. Came to the nurse's station and asked for a weapon. Refused Ativan po and was therefore given Ativan 1 mg IM. This was not forced as pt allowed it to be given, but did have other staff member steady his arm to keep him from jerking. Now he is more relaxed and said hello this Clinical research associate and did not seem to remember this Clinical research associate from before. He is walking in the hallway with his mother.

## 2018-04-05 NOTE — Progress Notes (Addendum)
Pt is not responding to assessment questions to writer at this time. Pt electively mutes. Pt appears to be disorganized with thought blocking. Pt is seen walking down hallway attempting to go into another male peer room. Pt was redirected back to room several times. Pt was heard in room from nurses station banging loudly at window. Pt was redirected by another RN and MHT but Pt continues with behavior. Provider on call notified and new orders obtained (see MAR). Press photographer and A.C. notified at this time. A show of force was required. After much encouragement from staff, Pt willingly took IM injections (see MAR). Pt is currently laying in bed with eyes open. Will continue to access Pt's needs.

## 2018-04-05 NOTE — Progress Notes (Signed)
Pt up trying to make a phone call, pt appeared to not understand the phone was off, trying to make a call.

## 2018-04-05 NOTE — Progress Notes (Signed)
Pt up banging on the window , when asked what he was doing pt would not respond.

## 2018-04-05 NOTE — Progress Notes (Signed)
Patient ID: Francisco Murray, male   DOB: 26-Apr-1996, 22 y.o.   MRN: [id number]  Patient admitted to [facility] from [facility] under voluntary status at 1645.  Pt guarded on arrival to unit, seemed to responding to internal stimuli, stated that he was hearing voices telling him that he would die, denied VH.  Pt had delayed responses, and once on the unit, pt refused offers of Ativan .  Pt was taken to his room, and then suddenly got up, went to the day room and began doing push ups.  Orders were obtained for Geodon  IM and Ativan  IM, and pt refused to take the meds, and when staff attempted to give him the medications, he swung at staff, became combative and required a physical hold from staff from 1750 to 1800 for agitation. Pt maintained on 1:1 for 30 minutes.

## 2018-04-06 DIAGNOSIS — F28 Other psychotic disorder not due to a substance or known physiological condition: Secondary | ICD-10-CM

## 2018-04-06 DIAGNOSIS — F333 Major depressive disorder, recurrent, severe with psychotic symptoms: Principal | ICD-10-CM

## 2018-04-06 DIAGNOSIS — F1390 Sedative, hypnotic, or anxiolytic use, unspecified, uncomplicated: Secondary | ICD-10-CM

## 2018-04-06 DIAGNOSIS — F419 Anxiety disorder, unspecified: Secondary | ICD-10-CM

## 2018-04-06 DIAGNOSIS — G4700 Insomnia, unspecified: Secondary | ICD-10-CM

## 2018-04-06 DIAGNOSIS — F1490 Cocaine use, unspecified, uncomplicated: Secondary | ICD-10-CM

## 2018-04-06 DIAGNOSIS — R450 Nervousness: Secondary | ICD-10-CM

## 2018-04-06 DIAGNOSIS — R451 Restlessness and agitation: Secondary | ICD-10-CM

## 2018-04-06 DIAGNOSIS — F1290 Cannabis use, unspecified, uncomplicated: Secondary | ICD-10-CM

## 2018-04-06 MED ORDER — LORAZEPAM 1 MG PO TABS
2.0000 mg | ORAL_TABLET | Freq: Four times a day (QID) | ORAL | Status: DC | PRN
  Administered 2018-04-06: 2 mg via ORAL
  Filled 2018-04-06: qty 2

## 2018-04-06 MED ORDER — RISPERIDONE 1 MG PO TABS
1.0000 mg | ORAL_TABLET | Freq: Two times a day (BID) | ORAL | Status: DC
  Administered 2018-04-06 – 2018-04-07 (×2): 1 mg via ORAL
  Filled 2018-04-06 (×8): qty 1

## 2018-04-06 MED ORDER — LORAZEPAM 2 MG/ML IJ SOLN: 1.0000 mg | Freq: Four times a day (QID) | INTRAMUSCULAR | Status: DC | PRN

## 2018-04-06 MED ORDER — LORAZEPAM 2 MG/ML IJ SOLN: 2.0000 mg | Freq: Four times a day (QID) | INTRAMUSCULAR | Status: DC | PRN

## 2018-04-06 MED ORDER — HYDROXYZINE HCL 50 MG PO TABS
50.0000 mg | ORAL_TABLET | Freq: Four times a day (QID) | ORAL | Status: DC | PRN
  Administered 2018-04-09 – 2018-04-11 (×2): 50 mg via ORAL
  Filled 2018-04-06 (×2): qty 1

## 2018-04-06 MED ORDER — RISPERIDONE 1 MG PO TBDP: 1.0000 mg | ORAL_TABLET | Freq: Three times a day (TID) | ORAL | Status: DC | PRN

## 2018-04-06 NOTE — BHH Group Notes (Signed)
LCSW Group Therapy Note   04/06/2018 1:15pm   Type of Therapy and Topic:  Group Therapy:  Positive Affirmations   Participation Level:  Did Not Attend  Description of Group: This group addressed positive affirmation toward self and others. Patients went around the room and identified two positive things about themselves and two positive things about a peer in the room. Patients reflected on how it felt to share something positive with others, to identify positive things about themselves, and to hear positive things from others. Patients were encouraged to have a daily reflection of positive characteristics or circumstances.  Therapeutic Goals 1. Patient will verbalize two of their positive qualities 2. Patient will demonstrate empathy for others by stating two positive qualities about a peer in the group 3. Patient will verbalize their feelings when voicing positive self affirmations and when voicing positive affirmations of others 4. Patients will discuss the potential positive impact on their wellness/recovery of focusing on positive traits of self and others. Summary of Patient Progress:    Therapeutic Modalities Cognitive Behavioral Therapy Motivational Interviewing  Ida Rogue, [place] 04/06/2018 12:47 PM

## 2018-04-06 NOTE — Plan of Care (Signed)
Patient is lethargic, difficult to awaken. Patient participates minimally in assessment. Encouraged to contact family, but not clear whether he followed through on calling them. Patient returned back to bed to sleep.
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
## 2018-04-06 NOTE — Progress Notes (Signed)
Adult Psychoeducational Group Note  Date:  04/06/2018 Time:  8:53 PM  Group Topic/Focus:  Wrap-Up Group:   The focus of this group is to help patients review their daily goal of treatment and discuss progress on daily workbooks.  Participation Level:  Active  Participation Quality:  Appropriate  Affect:  Appropriate  Cognitive:  Appropriate  Insight: Appropriate  Engagement in Group:  Engaged  Modes of Intervention:  Discussion  Additional Comments: The patient expressed he rates today a 6.The patient also said that his family visit was good.  Octavio Manns 04/06/2018, 8:53 PM

## 2018-04-06 NOTE — Progress Notes (Addendum)
Pt's father Harvie Heck) called this evening. Code was given. Update provided.

## 2018-04-06 NOTE — Progress Notes (Signed)
Recreation Therapy Notes  Date: 5.16.19 Time: 1000 Location: 500 Hall Dayroom  Group Topic: Self-Expression  Goal Area(s) Addresses:  Patient will successfully identify positive attributes about themselves.  Patient will successfully identify benefit of improved self-expression.   Intervention: Markers, colored pencils, construction paper  Activity: Personalized License Plate.  Patients were to design a license plate that described them, what they like and important dates.  Education:  Self-Esteem, Discharge Planning.   Education Outcome: Acknowledges education/In group clarification offered/Needs additional education  Clinical Observations/Feedback: Pt did not attend group.    Avayah Raffety, LRT/CTRS         Taysia Rivere A 04/06/2018 1:32 PM 

## 2018-04-06 NOTE — BHH Suicide Risk Assessment (Signed)
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 MDD (major depressive disorder), recurrent, severe, with psychosis (HCC) [F33.3] 04/05/2018  . 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  World Science writer [facility]). Score between 0-7:  no or low risk or alcohol related problems. Score between 8-15:  moderate risk of alcohol related problems. Score between 16-19:  high risk of alcohol related problems. Score 20 or above:  warrants further diagnostic evaluation for alcohol dependence and treatment.   CLINICAL FACTORS:   Severe Anxiety and/or Agitation Depression:   Aggression Impulsivity Severe Currently Psychotic Unstable or Poor Therapeutic Relationship    Psychiatric Specialty Exam: Physical Exam  Nursing note and vitals reviewed.   ROS- See H&P for details  Blood pressure 118/69, pulse (!) 124, resp. rate 20, height  (1.88 m), weight 81.6 kg (180 lb).Body mass index is 23.11 kg/m.    COGNITIVE FEATURES THAT CONTRIBUTE TO RISK:  Thought constriction (tunnel vision)    SUICIDE RISK:   Mild:  Suicidal ideation of limited frequency, intensity, duration, and specificity.  There are no identifiable plans, no associated intent, mild dysphoria and related symptoms, good self-control (both objective and subjective assessment), few  other risk factors, and identifiable protective factors, including available and accessible social support.  PLAN OF CARE: See H&P for details  I certify that inpatient services furnished can reasonably be expected to improve the patient's condition.   Micheal Likens, MD 04/06/2018, 5:55 PM

## 2018-04-06 NOTE — Progress Notes (Addendum)
Pt is observed eating a snack in the dayroom. Pt appears blank/anxious in affect and mood. Upon interaction, Pt was reluctant to take bedtime meds. Pt was getting irritable when writer encourage Pt to take schedule med this evening. After much encouragement and education, Pt proceeded to take meds. Pt shakes head "no" in response  to SI/HI/Pain. Pt endorses AH. Pt would not specify. Pt remains disorganized/thought blocking/delayed response with interaction. That being said, Pt presents calmer this evening. Pt benefited from visitation with parents this evening. PRN ativan offered and accepted.Will monitor.

## 2018-04-06 NOTE — Progress Notes (Signed)
D: Patient presents lethargic, with thought blocking, speech latency, disorganized. Patient slept most of the day, and was difficult to awaken for assessment. Patient reports having auditory hallucinations of both good and bad voices telling him to do things. He reports sometimes they tell him to harm other people, but patient denies intent to act on those command hallucinations. Patient feels estranged from his friends due to his "bad behavior." He described how he doesn't treat his friends well. Patient denies suicidal thoughts today, but reports having them occasionally. Patient is visibly depressed. A: Patient checked q15 min, and checks reviewed. Reviewed medication changes with patient and educated on side effects. Educated patient on importance of attending group therapy sessions. Encouarged participation in milieu through recreation therapy and attending meals with peers once patient feels ready, hopefully tomorrow.  R: Patient receptive to education on medications, and is medication compliant. Patient did not attend any group therapy sessions. Patient did not fill out a self inventory sheet. Patient contracts for safety on the unit.

## 2018-04-06 NOTE — Progress Notes (Signed)
Pt is observed laying in bed with eyes closed. Respirations even and unlabored. Will monitor.

## 2018-04-06 NOTE — Tx Team (Signed)
Initial Treatment Plan 04/06/2018 12:44 AM [name] ZOX:[id number]    PATIENT STRESSORS: Marital or family conflict Medication change or noncompliance   PATIENT STRENGTHS: Special hobby/interest Supportive family/friends   PATIENT IDENTIFIED PROBLEMS: "I want to leave"   "Anxiety"  Acute psychosis                  DISCHARGE CRITERIA:  Ability to meet basic life and health needs Improved stabilization in mood, thinking, and/or behavior Verbal commitment to aftercare and medication compliance  PRELIMINARY DISCHARGE PLAN: Attend PHP/IOP Outpatient therapy Participate in family therapy Return to previous living arrangement Return to previous work or school arrangements  PATIENT/FAMILY INVOLVEMENT: This treatment plan has been presented to and reviewed with the patient, Francisco Murray.The patient have been given the opportunity to ask questions and make suggestions.  [name], RN 04/06/2018, 12:44 AM

## 2018-04-06 NOTE — BHH Counselor (Signed)
Unable to conduct PSA, pt asleep all day.

## 2018-04-07 DIAGNOSIS — F29 Unspecified psychosis not due to a substance or known physiological condition: Secondary | ICD-10-CM

## 2018-04-07 DIAGNOSIS — F209 Schizophrenia, unspecified: Secondary | ICD-10-CM

## 2018-04-07 MED ORDER — RISPERIDONE 2 MG PO TABS
2.0000 mg | ORAL_TABLET | Freq: Every day | ORAL | Status: DC
  Administered 2018-04-07 – 2018-04-11 (×5): 2 mg via ORAL
  Filled 2018-04-07 (×9): qty 1

## 2018-04-07 MED ORDER — RISPERIDONE 1 MG PO TABS
1.0000 mg | ORAL_TABLET | ORAL | Status: DC
  Administered 2018-04-08 – 2018-04-12 (×5): 1 mg via ORAL
  Filled 2018-04-07 (×8): qty 1

## 2018-04-07 NOTE — Progress Notes (Signed)
Adult Psychoeducational Group Note  Date:  04/07/2018 Time:  8:48 PM  Group Topic/Focus:  Wrap-Up Group:   The focus of this group is to help patients review their daily goal of treatment and discuss progress on daily workbooks.  Participation Level:  Active  Participation Quality:  Appropriate  Affect:  Appropriate  Cognitive:  Appropriate  Insight: Appropriate  Engagement in Group:  Engaged  Modes of Intervention:  Discussion  Additional CommentsThe patient expressed that he rates today a 10.The patient also said that his goal was to become a better person.  Octavio Manns 04/07/2018, 8:48 PM

## 2018-04-07 NOTE — Plan of Care (Signed)
  Problem: Education: Goal: Knowledge of [place] [facility] Education information/materials will improve Outcome: Progressing   Problem: Coping: Goal: Ability to verbalize frustrations and anger appropriately will improve Outcome: Progressing   Problem: Nutritional: Goal: Ability to achieve adequate nutritional intake will improve Outcome: Progressing   Problem: Safety: Goal: Ability to redirect hostility and anger into socially appropriate behaviors will improve Outcome: Progressing  D: Pt visible in bed on initial contact. Denies SI, HI, AVH and pain "no" when assessed. Presents with flat affect  slow gait and appeared to be thought blocking with delay responses. Pt however has brightened up this evening; he's animated and spontaneous on interactions. Verbally engaged with staff and peers. Attended scheduled groups. Awake in dayroom eating dinner at this time.  Rates his depression 0/10, hopelessness 0/10 and anxiety 3/10 "just being in here and alone without my family".  A: Introduced self to pt. Emotional support and availability provided. All medications administered with verbal education and effects monitored. Encouraged pt to voice concerns, attend to ADLs and comply with current treatment regimen including scheduled groups. Safety checks maintained at Q 15 minutes intervals without outburst or self harm gestures.   R: Pt receptive to care. Compliant with medications when offered. Denies adverse drug reactions when assessed this shift. Tolerates all PO intake well. Remains safe on unit.

## 2018-04-07 NOTE — BHH Group Notes (Signed)
BHH LCSW Group Therapy  04/07/2018  1:15 PM  Type of Therapy:  Group therapy  Participation Level:  Did not attend  Participation Quality:    Affect:    Cognitive:    Insight:    Engagement in Therapy:   Modes of Intervention:  Discussion, Socialization  Summary of Progress/Problems:  Chaplain was here to lead a group on themes of hope and courage.  Greg Acy Orsak, LCSW  

## 2018-04-07 NOTE — Progress Notes (Addendum)
Pt's father Francisco Murray) called this morning. Code was given. Update provided.

## 2018-04-07 NOTE — Progress Notes (Signed)
Recreation Therapy Notes  INPATIENT RECREATION THERAPY ASSESSMENT  Patient Details Name: Francisco Murray MRN: [id number] DOB: Oct 09, 1996 Today's Date: 04/07/2018       Information Obtained From: Patient  Able to Participate in Assessment/Interview: Yes  Patient Presentation: Alert, Oriented  Reason for Admission (Per Patient): Other (Comments)(Irregular behavior)  Patient Stressors: Other (Comment)(Self)  Coping Skills:   Isolation, Self-Injury, Sports, TV, Arguments, Aggression, Music, Exercise, Meditate, Deep Breathing, Impulsivity, Talk, Prayer, Avoidance, Intrusive Behavior, Read, Hot Bath/Shower  Leisure Interests (2+):  Individual - Reading, Individual - TV, Sports - Basketball  Frequency of Recreation/Participation: Other (Comment)(When I can)  Awareness of Community Resources:  Yes  Community Resources:  Library, [employer]  Current Use: No  If no, Barriers?: Other (Comment)(Haven't since he was little)  Expressed Interest in [employer] Information: No  [place]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

## 2018-04-07 NOTE — Tx Team (Signed)
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 acetaminophen (TYLENOL) tablet 650 mg  650 mg Oral Q6H PRN Laveda Abbe, NP      . alum & mag hydroxide-simeth (MAALOX/MYLANTA) 200-200-20 MG/5ML suspension 30 mL  30 mL Oral Q4H PRN Laveda Abbe, NP      . hydrOXYzine (ATARAX/VISTARIL) tablet 50 mg  50 mg Oral Q6H PRN Micheal Likens, MD      . LORazepam (ATIVAN) tablet 2 mg  2 mg Oral Q6H PRN Micheal Likens, MD   2 mg at 04/06/18 2100   Or  . LORazepam (ATIVAN) injection 2 mg  2 mg Intramuscular Q6H PRN Micheal Likens, MD      . magnesium hydroxide (MILK OF MAGNESIA) suspension 30 mL  30 mL Oral Daily PRN Laveda Abbe, NP      . risperiDONE (RISPERDAL M-TABS) disintegrating tablet 1 mg  1 mg Oral Q8H PRN Micheal Likens, MD      . risperiDONE (RISPERDAL) tablet 1 mg  1 mg Oral BID Micheal Likens, MD   1 mg at 04/07/18 4098  . traZODone (DESYREL) tablet 50 mg  50 mg Oral QHS PRN Laveda Abbe, NP      . ziprasidone (GEODON) injection 20 mg  20 mg Intramuscular Q12H PRN Laveda Abbe, NP   20 mg at 04/05/18 1751   PTA Medications: No medications prior to admission.    Patient Stressors: Marital or family conflict Medication change or noncompliance  Patient Strengths: Special hobby/interest Supportive family/friends  Treatment Modalities: Medication Management, Group therapy, Case management,  1 to 1 session with clinician, Psychoeducation, Recreational  therapy.   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  Therapeutic Interventions: 1 to 1 sessions, Unit Group sessions and Medication administration.  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  Therapeutic Interventions: 1 to 1 sessions, Unit Group sessions and Medication administration.  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 RN will report any adverse and/or side effects to  prescribing provider.  Therapeutic Interventions: 1 on 1 counseling sessions, Psychoeducation, Medication administration, Evaluate responses to treatment, Monitor vital signs and CBGs as ordered, Perform/monitor CIWA, COWS, AIMS and Fall Risk screenings as ordered, Perform wound care treatments as ordered.  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  Short Term Goals: Engage patient in aftercare planning with referrals and resources, Increase social support and Increase skills for wellness and recovery  Therapeutic Interventions: Assess for all discharge needs, 1 to 1 time with Social worker, Explore available resources and support systems, Assess for adequacy in community support network, Educate family and significant other(s) on suicide prevention, Complete Psychosocial Assessment, Interpersonal group therapy.  Evaluation of Outcomes: Progressing   Progress in Treatment: Attending groups: Yes. Participating in groups: Yes. Taking medication as prescribed: Yes. Toleration medication: Yes. Family/Significant other contact made: No, will contact:  when given permission Patient understands diagnosis: No. Discussing patient identified problems/goals with staff: Yes. Medical problems stabilized or resolved: Yes. Denies suicidal/homicidal ideation: Yes. Issues/concerns per patient self-inventory: No. Other: none  New problem(s) identified: No, Describe:  none  New Short Term/Long Term Goal(s):Pt goal: "to get better, to treat people better."  Discharge Plan or Barriers:   Reason for Continuation of Hospitalization: Delusions  Hallucinations Medication stabilization  Estimated Length of Stay: 2-4  days.  Attendees: Patient: Francisco Murray 04/07/2018   Physician: Dr Altamese Francis, MD 04/07/2018   Nursing: Vinetta Bergamo, RN 04/07/2018   RN Care Manager: 04/07/2018   Social Worker: Daleen Squibb, LCSW 04/07/2018   Recreational Therapist:  04/07/2018   Other:  04/07/2018   Other:  04/07/2018  Other: 04/07/2018     Scribe for Treatment Team: Lorri Frederick, LCSW 04/07/2018 10:35 AM

## 2018-04-07 NOTE — Progress Notes (Signed)
Recreation Therapy Notes  Date: 5.17.19 Time: 1000 Location: 500 Hall Dayroom   Group Topic: Communication, Team Building, Problem Solving  Goal Area(s) Addresses:  Patient will effectively work with peer towards shared goal.  Patient will identify skill used to make activity successful.  Patient will identify how skills used during activity can be used to reach post d/c goals.   Behavioral Response: Engaged  Intervention: STEM Activity   Activity: [employer]. Patients were provided the following materials: 5 drinking straws, 5 rubber bands, 5 paper clips, 2 index cards, 2 drinking cups, and 2 toilet paper rolls. Using the provided materials patients were asked to build a launching mechanisms to launch a ping pong ball approximately 12 feet. Patients were divided into teams of 3-5.   Education: Pharmacist, community, Building control surveyor.   Education Outcome: Acknowledges education/In group clarification offered/Needs additional education.   Clinical Observations/Feedback:  Pt worked well with her peers.  Pt was seen staring off into space a few times.  Pt was able to be redirected and reengage with the activity.    Caroll Rancher, LRT/CTRS         Caroll Rancher A 04/07/2018 11:14 AM
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
## 2018-04-08 DIAGNOSIS — Z79899 Other long term (current) drug therapy: Secondary | ICD-10-CM

## 2018-04-08 NOTE — Progress Notes (Signed)
Adult Psychoeducational Group Note  Date:  04/08/2018 Time:  8:46 PM  Group Topic/Focus:  Wrap-Up Group:   The focus of this group is to help patients review their daily goal of treatment and discuss progress on daily workbooks.  Participation Level:  Active  Participation Quality:  Appropriate  Affect:  Appropriate  Cognitive:  Appropriate  Insight: Appropriate  Engagement in Group:  Improving  Modes of Intervention:  Discussion  Additional Comments: The patient expressed that he rates today a 9.The patient also said that he attended all groups.  Octavio Manns 04/08/2018, 8:46 PM

## 2018-04-08 NOTE — [facility] Group Notes (Signed)
  [facility] LCSW Group Therapy Note  Date/Time:  04/08/2018 11:30AM-12:00PM  Type of Therapy and Topic:  Group Therapy:  Feelings About Hospitalization  Participation Level:  Active   Description of Group This process group involved patients discussing their feelings related to being hospitalized, as well as the benefits they see to being in the hospital.  These feelings and benefits were itemized.  The group then brainstormed specific ways in which they could seek those same benefits when they discharge and return home.  Therapeutic Goals 1. Patient will identify and describe positive and negative feelings related to hospitalization 2. Patient will verbalize benefits of hospitalization to themselves personally 3. Patients will brainstorm together ways they can obtain similar benefits in the outpatient setting, identify barriers to wellness and possible solutions  Summary of Patient Progress:  The patient expressed his primary feelings about being hospitalized are that it has helped him to understand people better and "not be so rude."  When asked what steps he will take to take care of his wellness upon discharge, he said "read the book you gave me and be with my family."  CSW suggested that there is much more to coping skills than just the 7-day workbook given to him in the hospital, and that therapists and doctors are important keys to follow up that can help people stay well.  In response, he stated, "I'm just going to go with the flow and try to be a better person.  I don't need all that other stuff."   Therapeutic Modalities Cognitive Behavioral Therapy Motivational Interviewing    Ambrose Mantle, LCSW 04/08/2018, 12:09 PM

## 2018-04-08 NOTE — Progress Notes (Signed)
Writer has observed patient up in the dayroom watching tv with no interaction with other peers. Writer spoke with him 1:1 earlier and he reported being glad he was able to go off the unit. He continues to have moments where he stares and has thought blocking when asking him something. He reports that he wants to make sure he is doing the right thing. He was compliant with his medication tonight. Support given and safety maintained on unit with 15 min checks.Marland Kitchen

## 2018-04-08 NOTE — [facility] Group Notes (Signed)
[facility] Group Notes:  (Nursing/MHT/Case Management/Adjunct)  Date:  04/08/2018  Time:  12:50 PM  Type of Therapy:  Psychoeducational Skills  Participation Level:  Active  Participation Quality:  Appropriate  Affect:  Appropriate  Cognitive:  Appropriate  Insight:  Appropriate  Engagement in Group:  Engaged  Modes of Intervention:  Problem-solving  Summary of Progress/Problems: Pt attended Psychoeducational group with top topic anger management.  Jacquelyne Balint Shanta 04/08/2018, 12:50 PM

## 2018-04-08 NOTE — [facility] Group Notes (Signed)
[facility] Group Notes:  (Nursing/MHT/Case Management/Adjunct)  Date:  04/08/2018  Time:  12:50 PM  Type of Therapy:  Goals/Orientation Group.  Participation Level:  Active  Participation Quality:  Appropriate  Affect:  Appropriate  Cognitive:  Appropriate  Insight:  Appropriate  Engagement in Group:  Engaged  Modes of Intervention:  Discussion  Summary of Progress/Problems: Pt attended goals/orientation group, pt was receptive.   Jacquelyne Balint Shanta 04/08/2018, 12:50 PM

## 2018-04-08 NOTE — Progress Notes (Signed)
Patient ID: Francisco Murray, male   DOB: 04-04-1996, 22 y.o.   MRN: [id number]   D: Pt has been appropriate on the unit today, he has attended all groups and engaged in treatment. Pt reported that he was feeling much better today, and that he just wanted to go to lunch. Pt was allowed to go to lunch, per staff he was appropriate. Pt was removed from unit restriction. Pt reported that his depression was a 1, his hopelessness was a 0, and his anxiety was a 3. Pt reported that his goal for today was to improve self control. Pt reported being negative SI/HI, no AH/VH noted. A: 15 min checks continued for patient safety. R: Pt safety maintained.
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
## 2018-04-09 LAB — TSH: TSH: 1.351 u[IU]/mL (ref 0.350–4.500)

## 2018-04-09 LAB — LIPID PANEL
CHOL/HDL RATIO: 2.6 ratio
Cholesterol: 97 mg/dL (ref 0–200)
HDL: 37 mg/dL — AB (ref >40)
LDL CALC: 50 mg/dL (ref 0–99)
TRIGLYCERIDES: 50 mg/dL (ref <150)
VLDL: 10 mg/dL (ref 0–40)

## 2018-04-09 LAB — HEMOGLOBIN A1C
Hgb A1c MFr Bld: 4.8 % (ref 4.8–5.6)
Mean Plasma Glucose: 91.06 mg/dL

## 2018-04-09 NOTE — Progress Notes (Signed)
Pt presents with a flat affect and depressed mood. Pt rates depression 3/10. Anxiety 3/10. Pt denies SI/HI. Pt c/o feeling tired this morning and having a hard time concentrating. Pt denies racing thoughts, AVH and SI/HI. Pt stated goal for today is to "improve concentration and self control ".   Medications reviewed with pt. Verbal support provided. Pt encouraged to attend groups. 15 minute checks performed for safety.   Pt compliant with tx plan. No side effects to meds verbalized by pt.

## 2018-04-09 NOTE — [facility] Group Notes (Signed)
[facility] LCSW Group Therapy Note  Date/Time:  04/09/2018  11:00AM-12:00PM  Type of Therapy and Topic:  Group Therapy:  Music and Mood  Participation Level:  Active   Description of Group: In this process group, members listened to a variety of genres of music and identified that different types of music evoke different responses.  Patients were encouraged to identify music that was soothing for them and music that was energizing for them.  Patients discussed how this knowledge can help with wellness and recovery in various ways including managing depression and anxiety as well as encouraging healthy sleep habits.    Therapeutic Goals: 1. Patients will explore the impact of different varieties of music on mood 2. Patients will verbalize the thoughts they have when listening to different types of music 3. Patients will identify music that is soothing to them as well as music that is energizing to them 4. Patients will discuss how to use this knowledge to assist in maintaining wellness and recovery 5. Patients will explore the use of music as a coping skill  Summary of Patient Progress:  At the beginning of group, patient expressed that he felt "authentic" and he participated throughout group then said he felt "better."  Therapeutic Modalities: Solution Focused Brief Therapy Activity   Ambrose Mantle, LCSW
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
## 2018-04-09 NOTE — [facility] Group Notes (Signed)
[facility] Group Notes:  (Nursing)  Date:  04/09/2018  Time:  1:30 PM Type of Therapy:  Nurse Education  Participation Level:  Active  Participation Quality:  Appropriate and Attentive  Affect:  Appropriate  Cognitive:  Alert and Appropriate  Insight:  Appropriate and Good  Engagement in Group:  Engaged  Modes of Intervention:  Discussion and Education  Summary of Progress/Problems: Patient actively engaged in nurse led group. Group played a non competitive learning/communication board game that fosters listening skills as well as self expression.   Shela Nevin 04/09/2018, 3:28 PM

## 2018-04-10 LAB — PROLACTIN: Prolactin: 51.6 ng/mL — ABNORMAL HIGH (ref 4.0–15.2)

## 2018-04-10 NOTE — [facility] Group Notes (Signed)
[facility] LCSW Group Therapy Note  Date/Time: 520/19, 1315  Type of Therapy and Topic:  Group Therapy:  Overcoming Obstacles  Participation Level:  active  Description of Group:    In this group patients will be encouraged to explore what they see as obstacles to their own wellness and recovery. They will be guided to discuss their thoughts, feelings, and behaviors related to these obstacles. The group will process together ways to cope with barriers, with attention given to specific choices patients can make. Each patient will be challenged to identify changes they are motivated to make in order to overcome their obstacles. This group will be process-oriented, with patients participating in exploration of their own experiences as well as giving and receiving support and challenge from other group members.  Therapeutic Goals: 1. Patient will identify personal and current obstacles as they relate to admission. 2. Patient will identify barriers that currently interfere with their wellness or overcoming obstacles.  3. Patient will identify feelings, thought process and behaviors related to these barriers. 4. Patient will identify two changes they are willing to make to overcome these obstacles:    Summary of Patient Progress: Pt identified time management and depression as obstacles in his life.  Pt was active in group discussion about finding ways to take steps to overcome obstacles.        Therapeutic Modalities:   Cognitive Behavioral Therapy Solution Focused Therapy Motivational Interviewing Relapse Prevention Therapy  Daleen Squibb, LCSW

## 2018-04-10 NOTE — Progress Notes (Signed)
Pt attended group this evening. 

## 2018-04-10 NOTE — [facility] Group Notes (Signed)
[facility] Group Notes:  (Nursing/MHT/Case Management/Adjunct)  Date:  04/10/2018  Time:  4:15 pm  Type of Therapy:  Psychoeducational Skills  Participation Level:  Active  Participation Quality:  Appropriate  Affect:  Appropriate  Cognitive:  Appropriate  Insight:  Appropriate  Engagement in Group:  Engaged  Modes of Intervention:  Discussion  Summary of Progress/Problems: Patient alert and engaged appropriately in group.   Earline Mayotte 04/10/2018, 6:20 PM

## 2018-04-10 NOTE — [facility] Suicide Risk Assessment (Signed)
[facility] INPATIENT:  Family/Significant Other Suicide Prevention Education  Suicide Prevention Education:  Education Completed;  Francisco Murray, father, [phone] been identified by the patient as the family member/significant other with whom the patient will be residing, and identified as the person(s) who will aid the patient in the event of a mental health crisis (suicidal ideations/suicide attempt).  With written consent from the patient, the family member/significant other has been provided the following suicide prevention education, prior to the and/or following the discharge of the patient.  The suicide prevention education provided includes the following:  Suicide risk factors  Suicide prevention and interventions  National Suicide Hotline telephone number  [facility] assessment telephone number  [facility] Emergency Assistance 911  [facility] and/or Residential Mobile Crisis Unit telephone number  Request made of family/significant other to:  Remove weapons (e.g., guns, rifles, knives), all items previously/currently identified as safety concern.  Father reports they do have several guns for hunting in the home but they are locked.  Remove drugs/medications (over-the-counter, prescriptions, illicit drugs), all items previously/currently identified as a safety concern.  The family member/significant other verbalizes understanding of the suicide prevention education information provided.  The family member/significant other agrees to remove the items of safety concern listed above.  Father reports they visited pt every day this weekend and he seems like he is doing better, particularly is more focused.  Pt was supposed to study abroad this summer--they had to cancel.  Need a form filled out.    Lorri Frederick, LCSW 04/10/2018, 11:17 AM

## 2018-04-10 NOTE — Plan of Care (Signed)
Patient showing significant improvement since admission. Still lethargic this morning, reporting sleep was good, but "I am still waking up." Patient is attending groups, and participating in plan of care. Medication compliant.

## 2018-04-10 NOTE — Progress Notes (Signed)
Pt observed in the dayroom not interacting. Pt with a confused affected endorsed mild anxiety and depression; "I have very little anxiety and depression." Pt denied SI/HI, pain or AVH. Pt contracts for safety. Medications offered as prescribed. All patient's questions and concerns addressed. Support, encouragement, and safe environment provided. 15-minute safety checks continue. Pt was med compliant. Safety checks continue.
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
## 2018-04-11 NOTE — Progress Notes (Signed)
Patient denies SI, HI and AVH this shift.  Patient has attended groups and engaged in unit activities.    Assess patient for safety, offer medications as prescribed, engage patient in 1:1 staff talks.   Patient able to contract for safety, continue to monitor as planned.   

## 2018-04-11 NOTE — Progress Notes (Signed)
Patient ID: Francisco Murray, male   DOB: Sep 17, 1996, 22 y.o.   MRN: [id number]   D: Patient overly pleasant on approach. Has a bizarre affect at times. Not seen interacting with peers but visited with parents. Father reports he feels he may have decompensated some today. This is undersigns first meeting with this patient so unknown of his stability from before. Father also reported he seemed a little sedated but patient wide awake tonight and ended up taking sleep medication. Patient was hesitant when asking when he wanted his medication saying " I don't think I need any medication" but took without issue when telling him he needs to take the Risperdal. He later requested sleep med. His thinking is very concrete tonight. A: Staff will monitor on q 15 minute checks, follow treatment plan, and give medications as ordered. R: Cooperative on the unit.

## 2018-04-11 NOTE — [facility] Group Notes (Addendum)
LCSW Group Therapy Note 04/11/2018 2:37 PM  Type of Therapy/Topic: Group Therapy: Feelings about Diagnosis  Participation Level: Did Not Attend   Description of Group:  This group will allow patients to explore their thoughts and feelings about diagnoses they have received. Patients will be guided to explore their level of understanding and acceptance of these diagnoses. Facilitator will encourage patients to process their thoughts and feelings about the reactions of others to their diagnosis and will guide patients in identifying ways to discuss their diagnosis with significant others in their lives. This group will be process-oriented, with patients participating in exploration of their own experiences, giving and receiving support, and processing challenge from other group members.  Therapeutic Goals: 1. Patient will demonstrate understanding of diagnosis as evidenced by identifying two or more symptoms of the disorder 2. Patient will be able to express two feelings regarding the diagnosis 3. Patient will demonstrate their ability to communicate their needs through discussion and/or role play  Summary of Patient Progress:  Mc was engaged and participated throughout the group session. Zubair reports that he felt the same when he learned he had a mental health diagnosis. Kadeem reports that he plans to be more consistent and compliant with his medications in order to manage his mental health diagnosis.    Therapeutic Modalities:  Cognitive Behavioral Therapy Brief Therapy Feelings Identification    Delainey Winstanley Catalina Antigua Clinical Social Worker
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
## 2018-04-12 MED ORDER — HYDROXYZINE HCL 50 MG PO TABS: 50.0000 mg | ORAL_TABLET | Freq: Four times a day (QID) | ORAL | 0 refills | Status: DC | PRN

## 2018-04-12 MED ORDER — TRAZODONE HCL 50 MG PO TABS: 50.0000 mg | ORAL_TABLET | Freq: Every evening | ORAL | 0 refills | Status: DC | PRN

## 2018-04-12 MED ORDER — RISPERIDONE 1 MG PO TABS: ORAL_TABLET | ORAL | 0 refills | Status: DC
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
## 2018-04-12 NOTE — Progress Notes (Addendum)
CSW spoke to pt father who knows Dr Caralyn Guile professionally and contacted his office about providing services.  Father scheduled intake appt for 04/18/18 at 4:45.  Father spoke to [place] who is in agreement with Dr Dellia Cloud taking over therapy for pt. Garner Nash, MSW, LCSW Clinical Social Worker 04/12/2018 3:07 PM   CSW received call from pt father who said that when his wife tried to pick up medication at the pharmacy, insurance refused to allow them to dispense the medication as prescribed: one pill in AM, 2 pills in PM.  CSW spoke to Serena Colonel, NP, who had already spoken to pharmacy in an attempt to work this out.  CSW called father back--they did get prescription filled but only at dose of 1 in AM and 1 in PM.  CSW spoke to Dr Altamese Swink in AM who recommends giving pt meds at this reduced dose: 1 in AM, 1 in PM and then speak to outpt psychiatrist at follow up appt. (June 4).  CSW spoke to pt father, Harvie Heck on 04/13/18 at 0935 and conveyed this information.Garner Nash, MSW, LCSW Clinical Social Worker 04/13/2018 9:38 AM

## 2018-04-12 NOTE — Progress Notes (Signed)
Patient self inventory: Patient slept well last night, appetite has been good, energy level normal, concentration good. Depression is rated 2 out of 10, and hopelessness and anxiety are rated 0 out of 10. Denies SI/HI/AVH. Denies physical pain. Patient's goal today is to "get self control of thought process by focus on what I'm thinking." Patient pleasant upon approach, cooperative with commands. Denied auditory hallucinations, but said "I'm just listening to my thoughts."  Safety maintained with 15 minute checks. Support and encouragement given.

## 2018-04-12 NOTE — Progress Notes (Signed)
  [facility] Adult Case Management Discharge Plan :  Will you be returning to the same living situation after discharge:  Yes,  with parents At discharge, do you have transportation home?: Yes,  parents Do you have the ability to pay for your medications: Yes,  [place]  Release of information consent forms completed and in the chart;  Patient's signature needed at discharge.  Patient to Follow up at: Follow-up Information    [facility] Go on 04/25/2018.   Why:  Please attend your medication appt on Tuesday, 04/25/18, at 11:45am. Contact information: [address] [place] [address] [phone]        [facility]. Go on 04/14/2018.   Why:  Please attend your therapy appt with Danae Orleans on Friday, 04/14/18, at 10:00am. Contact information: [address]   [address] [place], [place] [address]  P: [phone]           Next level of care provider has access to [facility] Link:no  Safety Planning and Suicide Prevention discussed: Yes,  with father     Has patient been referred to the Quitline?: Patient refused referral  Patient has been referred for addiction treatment: Pt. refused referral  Lorri Frederick, LCSW 04/12/2018, 10:31 AM
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
## 2018-04-12 NOTE — Progress Notes (Signed)
Discharge note: Paperwork and prescriptions were reviewed with patient as well as patient's mother and father. All belongings returned to patient; nothing was in the patient's locker. Discharge information was understood and all questions were answered. Patient still denied SI/HI/AVH. Patient walked to lobby to be discharged home with parents.

## 2018-04-12 NOTE — Progress Notes (Signed)
Patient ID: Francisco Murray, male   DOB: [birth year], 21 y.o.   MRN: [id number] Pt observed in the dayroom. Pt looked to be reacting to some internal stimuli; Pt however, continues to be nonviolent. Pt with a confused affected endorsed moderate anxiety and worrying; "there is something going on in my head." Pt denied SI/HI depression or pain. Pt contracts for safety. Medications offered as prescribed. All patient's questions and concerns addressed. Support, encouragement, and safe environment provided. 15-minute safety checks continue. Pt was med compliant. Safety checks continue.

## 2018-04-12 NOTE — [facility] Group Notes (Signed)
[facility] Mental Health Association Group Therapy 04/12/2018 1:15pm  Type of Therapy: Mental Health Association Presentation  Participation Level: Active  Participation Quality: Attentive  Affect: Appropriate  Cognitive: Oriented  Insight: Developing/Improving  Engagement in Therapy: Engaged  Modes of Intervention: Discussion, Education and Socialization  Summary of Progress/Problems: Mental Health Association (MHA) Speaker came to talk about his personal journey with mental health. The pt processed ways by which to relate to the speaker. MHA speaker provided handouts and educational information pertaining to groups and services offered by the MHA. Pt was engaged in speaker's presentation and was receptive to resources provided.    Kanija Remmel N Smart, LCSW 04/12/2018 10:44 AM  

## 2018-04-14 NOTE — Progress Notes (Signed)
CSW received call from [facility].  Pt not doing well, staring into space.  Harvie Heck is concerned.  CSW called Dr Altamese  who recommends that pt take the  dose at bedtime again.  Pt will be seen by outpt psychiatrist prior to running out of medication.  CSW spoke to [place] and passed this on to him.  Harvie Heck gave pt vistaril and pt seemed to be helped by this.  Will change dose back to  in AM,  in PM. Garner Nash, MSW, LCSW Clinical Social Worker 04/14/2018 4:01 PM

## 2018-04-18 ENCOUNTER — Ambulatory Visit (INDEPENDENT_AMBULATORY_CARE_PROVIDER_SITE_OTHER): Payer: BLUE CROSS/BLUE SHIELD | Admitting: Psychology

## 2018-04-18 DIAGNOSIS — F2081 Schizophreniform disorder: Secondary | ICD-10-CM

## 2018-04-28 ENCOUNTER — Ambulatory Visit (INDEPENDENT_AMBULATORY_CARE_PROVIDER_SITE_OTHER): Payer: BLUE CROSS/BLUE SHIELD | Admitting: Psychology

## 2018-04-28 DIAGNOSIS — F2081 Schizophreniform disorder: Secondary | ICD-10-CM | POA: Diagnosis not present

## 2018-05-16 ENCOUNTER — Ambulatory Visit (INDEPENDENT_AMBULATORY_CARE_PROVIDER_SITE_OTHER): Payer: BLUE CROSS/BLUE SHIELD | Admitting: Psychology

## 2018-05-16 DIAGNOSIS — F2081 Schizophreniform disorder: Secondary | ICD-10-CM

## 2018-05-22 ENCOUNTER — Ambulatory Visit (INDEPENDENT_AMBULATORY_CARE_PROVIDER_SITE_OTHER): Payer: BLUE CROSS/BLUE SHIELD | Admitting: Psychology

## 2018-05-22 DIAGNOSIS — F2081 Schizophreniform disorder: Secondary | ICD-10-CM

## 2018-05-30 ENCOUNTER — Ambulatory Visit (INDEPENDENT_AMBULATORY_CARE_PROVIDER_SITE_OTHER): Payer: BLUE CROSS/BLUE SHIELD | Admitting: Psychology

## 2018-05-30 DIAGNOSIS — F2081 Schizophreniform disorder: Secondary | ICD-10-CM

## 2020-01-26 IMAGING — CT CT HEAD W/O CM
3 series · 15 of 47 positions shown, 18 images · non-contrast
Comparison: CT head 08/07/2015

CLINICAL DATA: Altered level of consciousness.

EXAM:
CT HEAD WITHOUT CONTRAST
TECHNIQUE: Contiguous axial images were obtained from the base of the skull
through the vertex without intravenous contrast.

[Series 2: head wo · axial · 0.44mm/px · z∈[-131,-6]mm · 9 of 31 slices shown, 12 images]
[im 3/31  brain]
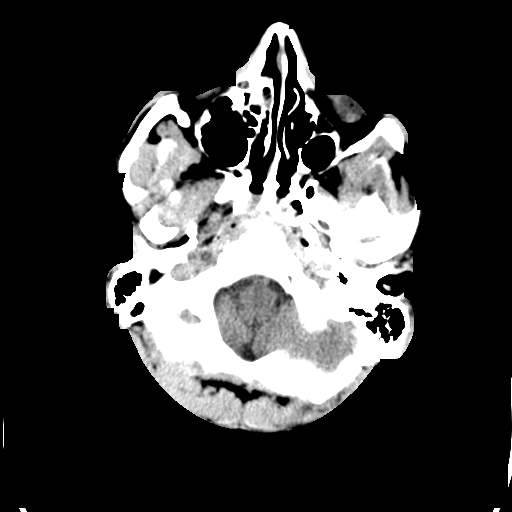
[im 3/31  bone]
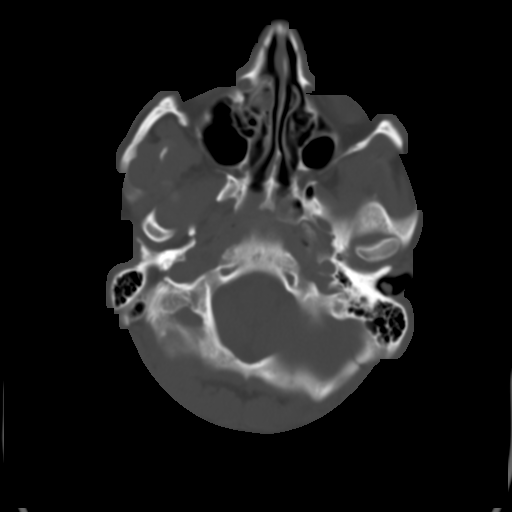
[im 6/31  brain]
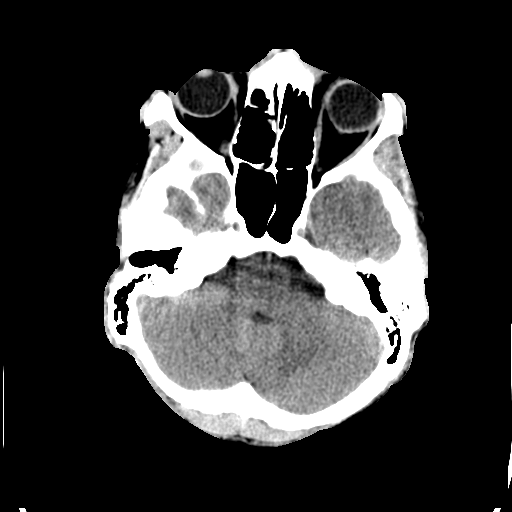
[im 9/31  brain]
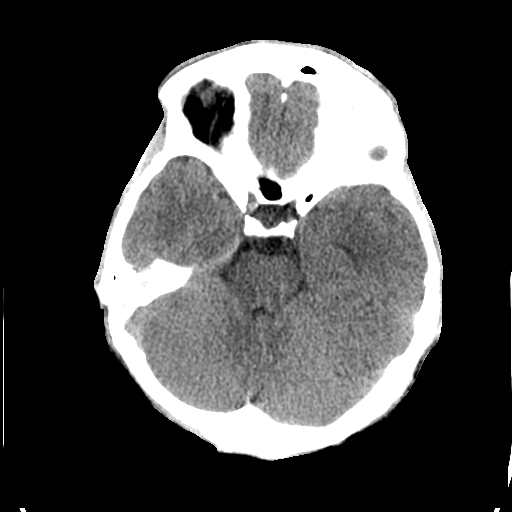
[im 12/31  brain]
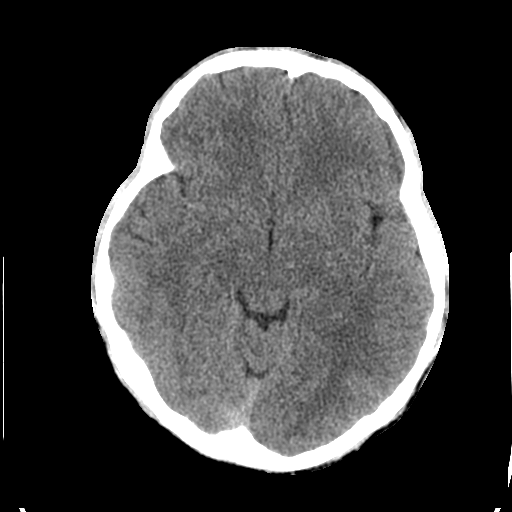
[im 16/31  brain]
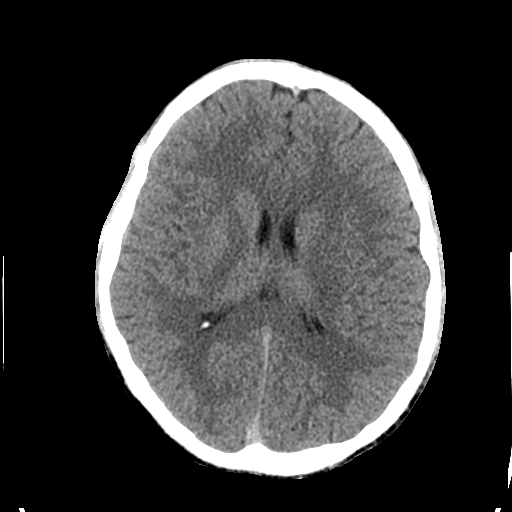
[im 16/31  bone]
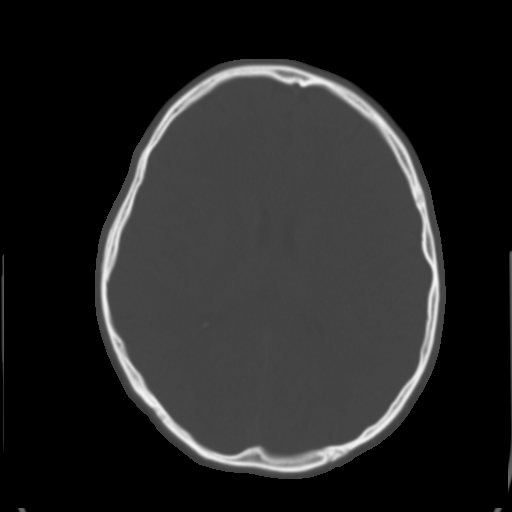
[im 19/31  brain]
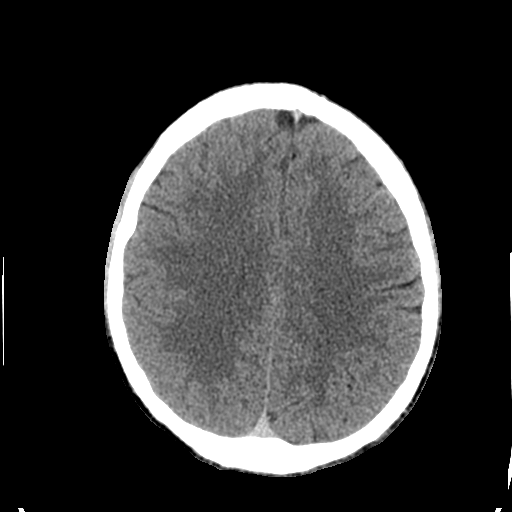
[im 22/31  brain]
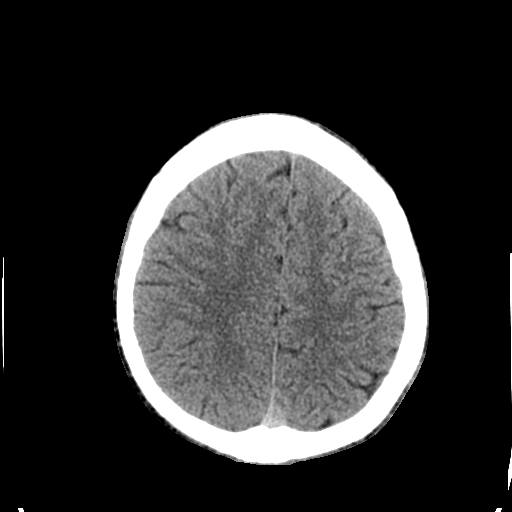
[im 25/31  brain]
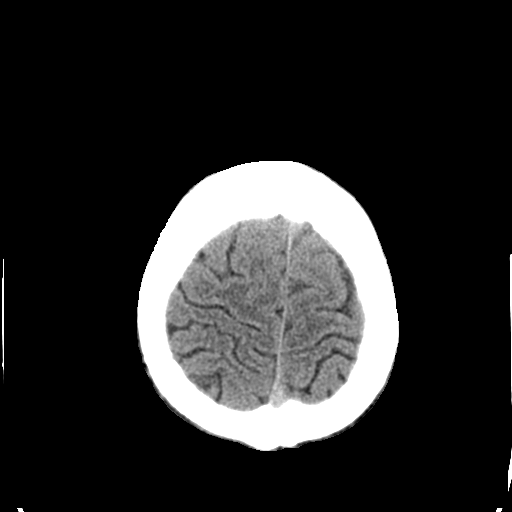
[im 28/31  brain]
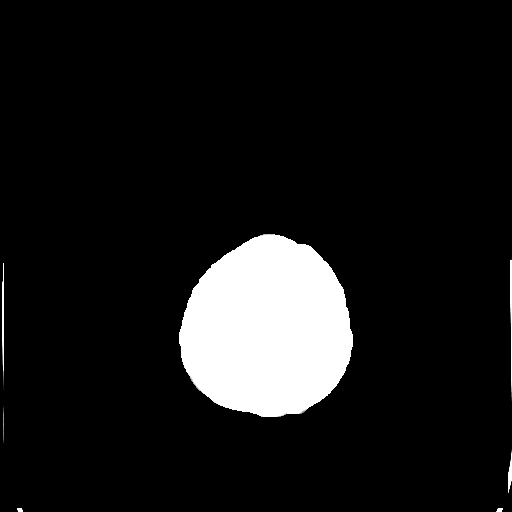
[im 28/31  bone]
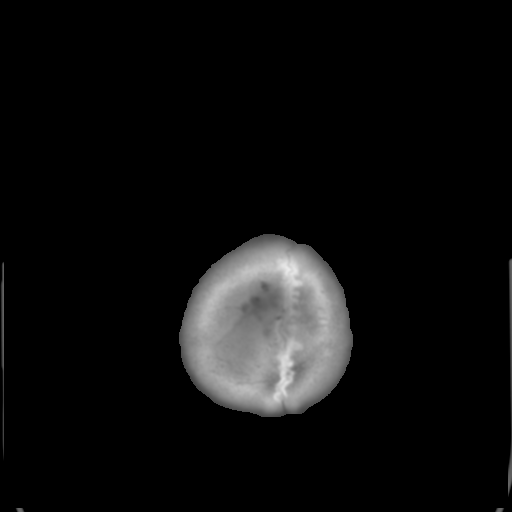

[Series 4: coronal soft tissue · coronal · 0.30mm/px · 3 of 65 slices shown]
[im 22/65  brain]
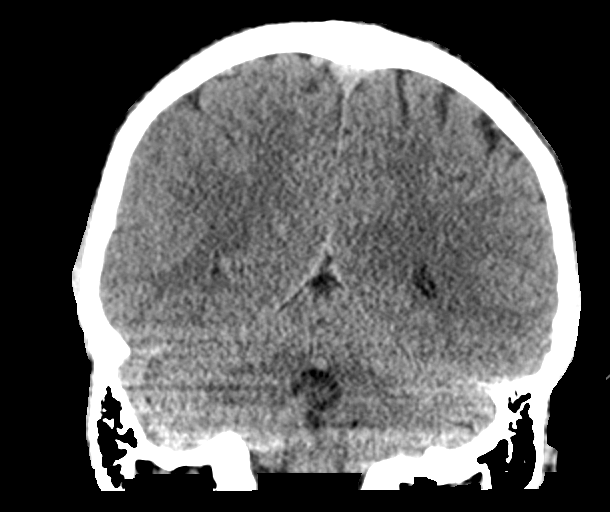
[im 29/65  brain]
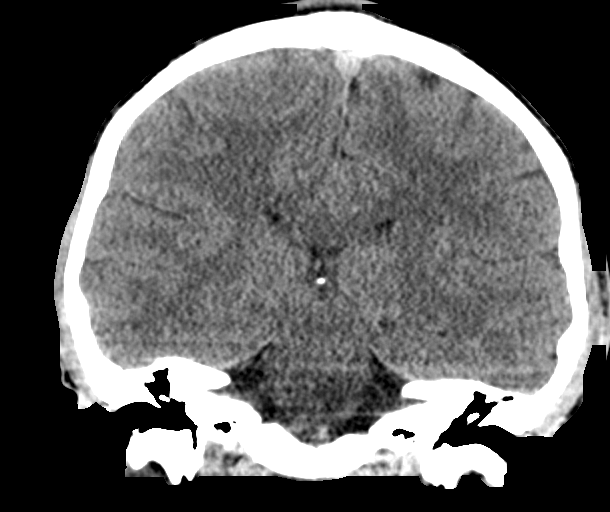
[im 36/65  brain]
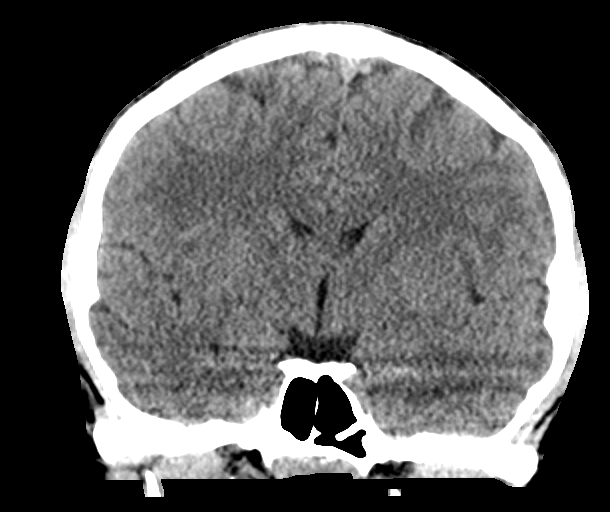

[Series 5: sagittal soft tissue · sagittal · 0.29mm/px · 3 of 58 slices shown]
[im 20/58  brain]
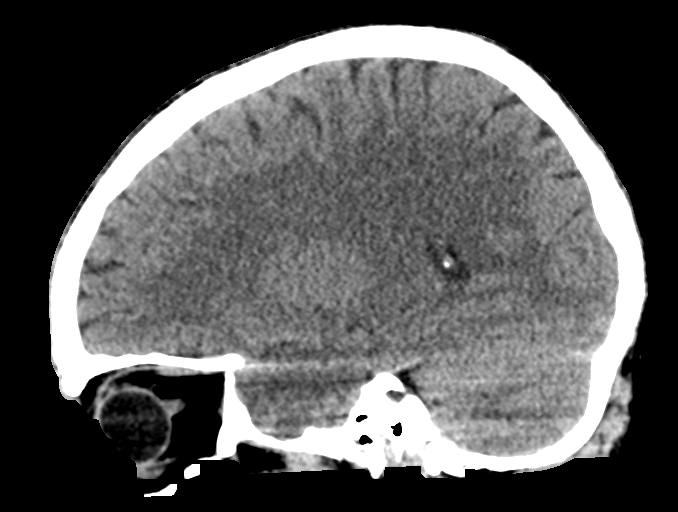
[im 29/58  brain]
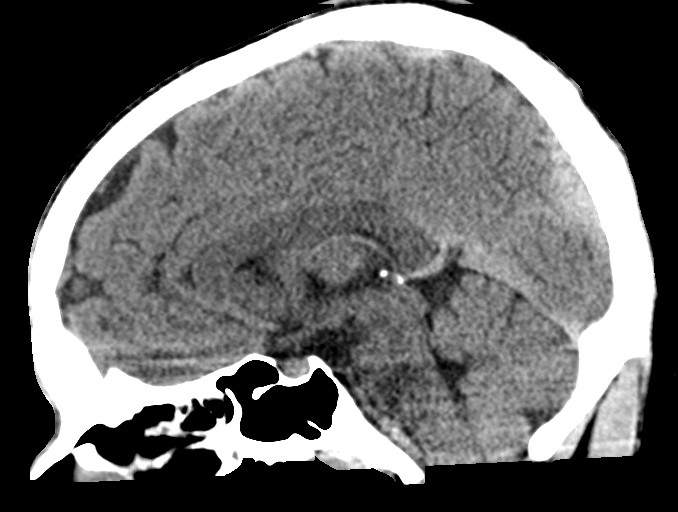
[im 39/58  brain]
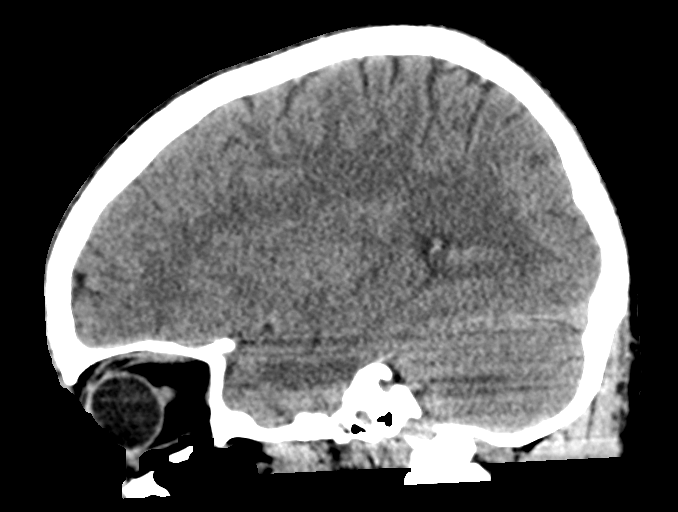

[15 of 47 positions shown; findings below may reference images not displayed]

FINDINGS: Brain: No evidence of acute infarction, hemorrhage, hydrocephalus,
extra-axial collection or mass lesion/mass effect.

Vascular: No hyperdense vessel or unexpected calcification.

Skull: Negative

Sinuses/Orbits: Negative

Other: None
IMPRESSION: Negative CT head

## 2024-09-12 ENCOUNTER — Encounter (HOSPITAL_COMMUNITY): Payer: Self-pay | Admitting: Emergency Medicine

## 2024-09-12 ENCOUNTER — Other Ambulatory Visit: Payer: Self-pay

## 2024-09-12 ENCOUNTER — Emergency Department (HOSPITAL_COMMUNITY)
Admission: EM | Admit: 2024-09-12 | Discharge: 2024-09-12 | Disposition: A | Discharge status: Home / Self Care | Attending: Emergency Medicine | Admitting: Emergency Medicine

## 2024-09-12 ENCOUNTER — Ambulatory Visit (INDEPENDENT_AMBULATORY_CARE_PROVIDER_SITE_OTHER)
Admission: EM | Admit: 2024-09-12 | Discharge: 2024-09-12 | Disposition: A | Discharge status: Other Acute Inpatient Hospital | Source: Home / Self Care

## 2024-09-12 DIAGNOSIS — R21 Rash and other nonspecific skin eruption: Secondary | ICD-10-CM | POA: Diagnosis present

## 2024-09-12 DIAGNOSIS — L309 Dermatitis, unspecified: Secondary | ICD-10-CM | POA: Diagnosis not present

## 2024-09-12 DIAGNOSIS — Z79899 Other long term (current) drug therapy: Secondary | ICD-10-CM | POA: Insufficient documentation

## 2024-09-12 DIAGNOSIS — F411 Generalized anxiety disorder: Secondary | ICD-10-CM | POA: Insufficient documentation

## 2024-09-12 DIAGNOSIS — F200 Paranoid schizophrenia: Secondary | ICD-10-CM | POA: Diagnosis not present

## 2024-09-12 DIAGNOSIS — F29 Unspecified psychosis not due to a substance or known physiological condition: Secondary | ICD-10-CM | POA: Diagnosis present

## 2024-09-12 LAB — CBC WITH DIFFERENTIAL/PLATELET
Abs Immature Granulocytes: 0.04 K/uL (ref 0.00–0.07)
Basophils Absolute: 0 K/uL (ref 0.0–0.1)
Basophils Relative: 0 %
Eosinophils Absolute: 0.1 K/uL (ref 0.0–0.5)
Eosinophils Relative: 1 %
HCT: 44 % (ref 39.0–52.0)
Hemoglobin: 14.6 g/dL (ref 13.0–17.0)
Immature Granulocytes: 0 %
Lymphocytes Relative: 14 %
Lymphs Abs: 1.8 K/uL (ref 0.7–4.0)
MCH: 29.2 pg (ref 26.0–34.0)
MCHC: 33.2 g/dL (ref 30.0–36.0)
MCV: 88 fL (ref 80.0–100.0)
Monocytes Absolute: 0.9 K/uL (ref 0.1–1.0)
Monocytes Relative: 7 %
Neutro Abs: 10.3 K/uL — ABNORMAL HIGH (ref 1.7–7.7)
Neutrophils Relative %: 78 %
Platelets: 321 K/uL (ref 150–400)
RBC: 5 MIL/uL (ref 4.22–5.81)
RDW: 13.2 % (ref 11.5–15.5)
WBC: 13.1 K/uL — ABNORMAL HIGH (ref 4.0–10.5)
nRBC: 0 % (ref 0.0–0.2)

## 2024-09-12 LAB — ETHANOL: Alcohol, Ethyl (B): <15 mg/dL (ref <15)

## 2024-09-12 LAB — COMPREHENSIVE METABOLIC PANEL WITH GFR
ALT: 33 U/L (ref 0–44)
AST: 33 U/L (ref 15–41)
Albumin: 4.2 g/dL (ref 3.5–5.0)
Alkaline Phosphatase: 71 U/L (ref 38–126)
Anion gap: 13 (ref 5–15)
BUN: 15 mg/dL (ref 6–20)
CO2: 20 mmol/L — ABNORMAL LOW (ref 22–32)
Calcium: 9.3 mg/dL (ref 8.9–10.3)
Chloride: 100 mmol/L (ref 98–111)
Creatinine, Ser: 1.21 mg/dL (ref 0.61–1.24)
GFR, Estimated: >60 mL/min (ref >60)
Glucose, Bld: 83 mg/dL (ref 70–99)
Potassium: 4.1 mmol/L (ref 3.5–5.1)
Sodium: 133 mmol/L — ABNORMAL LOW (ref 135–145)
Total Bilirubin: 1.2 mg/dL (ref 0.0–1.2)
Total Protein: 7 g/dL (ref 6.5–8.1)

## 2024-09-12 LAB — TSH: TSH: 2.052 u[IU]/mL (ref 0.350–4.500)

## 2024-09-12 LAB — LIPID PANEL
Cholesterol: 123 mg/dL (ref 0–200)
HDL: 43 mg/dL (ref >40)
LDL Cholesterol: 67 mg/dL (ref 0–99)
Total CHOL/HDL Ratio: 2.9 ratio
Triglycerides: 67 mg/dL (ref <150)
VLDL: 13 mg/dL (ref 0–40)

## 2024-09-12 LAB — MAGNESIUM: Magnesium: 2.1 mg/dL (ref 1.7–2.4)

## 2024-09-12 LAB — HEMOGLOBIN A1C
Hgb A1c MFr Bld: 5 % (ref 4.8–5.6)
Mean Plasma Glucose: 96.8 mg/dL

## 2024-09-12 MED ORDER — ACETAMINOPHEN 325 MG PO TABS: 650.0000 mg | ORAL_TABLET | Freq: Four times a day (QID) | ORAL | Status: DC | PRN

## 2024-09-12 MED ORDER — DIPHENHYDRAMINE HCL 50 MG PO CAPS: 50.0000 mg | ORAL_CAPSULE | Freq: Three times a day (TID) | ORAL | Status: DC | PRN

## 2024-09-12 MED ORDER — LORAZEPAM 1 MG PO TABS
2.0000 mg | ORAL_TABLET | Freq: Once | ORAL | Status: AC
  Administered 2024-09-12: 2 mg via ORAL
  Filled 2024-09-12: qty 2

## 2024-09-12 MED ORDER — HALOPERIDOL 5 MG PO TABS: 5.0000 mg | ORAL_TABLET | Freq: Three times a day (TID) | ORAL | Status: DC | PRN

## 2024-09-12 MED ORDER — HYDROXYZINE HCL 25 MG PO TABS
25.0000 mg | ORAL_TABLET | Freq: Three times a day (TID) | ORAL | Status: DC | PRN
  Administered 2024-09-12: 25 mg via ORAL
  Filled 2024-09-12: qty 1

## 2024-09-12 MED ORDER — ALUM & MAG HYDROXIDE-SIMETH 200-200-20 MG/5ML PO SUSP: 30.0000 mL | ORAL | Status: DC | PRN

## 2024-09-12 MED ORDER — DIPHENHYDRAMINE HCL 50 MG/ML IJ SOLN: 50.0000 mg | Freq: Three times a day (TID) | INTRAMUSCULAR | Status: DC | PRN

## 2024-09-12 MED ORDER — RISPERIDONE 1 MG PO TBDP
1.0000 mg | ORAL_TABLET | Freq: Two times a day (BID) | ORAL | Status: DC
Start: 2024-09-12 — End: 2024-09-12
  Administered 2024-09-12: 1 mg via ORAL
  Filled 2024-09-12: qty 1

## 2024-09-12 MED ORDER — HALOPERIDOL LACTATE 5 MG/ML IJ SOLN: 10.0000 mg | Freq: Three times a day (TID) | INTRAMUSCULAR | Status: DC | PRN

## 2024-09-12 MED ORDER — DIPHENHYDRAMINE HCL 50 MG PO CAPS
50.0000 mg | ORAL_CAPSULE | Freq: Once | ORAL | Status: AC
  Administered 2024-09-12: 50 mg via ORAL
  Filled 2024-09-12: qty 1

## 2024-09-12 MED ORDER — HALOPERIDOL LACTATE 5 MG/ML IJ SOLN: 5.0000 mg | Freq: Three times a day (TID) | INTRAMUSCULAR | Status: DC | PRN

## 2024-09-12 MED ORDER — TRAZODONE HCL 50 MG PO TABS: 50.0000 mg | ORAL_TABLET | Freq: Every evening | ORAL | Status: DC | PRN

## 2024-09-12 MED ORDER — HALOPERIDOL 5 MG PO TABS
5.0000 mg | ORAL_TABLET | Freq: Once | ORAL | Status: AC
  Administered 2024-09-12: 5 mg via ORAL
  Filled 2024-09-12: qty 1

## 2024-09-12 MED ORDER — BENZTROPINE MESYLATE 0.5 MG PO TABS
0.5000 mg | ORAL_TABLET | Freq: Two times a day (BID) | ORAL | Status: DC
  Administered 2024-09-12: 0.5 mg via ORAL
  Filled 2024-09-12: qty 1

## 2024-09-12 MED ORDER — MAGNESIUM HYDROXIDE 400 MG/5ML PO SUSP: 30.0000 mL | Freq: Every day | ORAL | Status: DC | PRN

## 2024-09-12 MED ORDER — LORAZEPAM 2 MG/ML IJ SOLN: 2.0000 mg | Freq: Three times a day (TID) | INTRAMUSCULAR | Status: DC | PRN

## 2024-09-12 MED ORDER — CLOBETASOL PROPIONATE 0.05 % EX OINT: 1.0000 | TOPICAL_OINTMENT | Freq: Two times a day (BID) | CUTANEOUS | 0 refills | Status: AC

## 2024-09-12 NOTE — Discharge Instructions (Addendum)
Nizoral shampoo

## 2024-09-12 NOTE — Progress Notes (Signed)
 Inpatient Psychiatric Referral  Patient was recommended inpatient per [name] , NP . There are no available beds at [facility], per [facility]. Patient was referred to the following out of network facilities:  Destination  Service Provider Address Phone Fax  [facility]  [address] [place], [place] [address] [phone]  [facility]  [address], [place] [address] [phone]  [facility]  [address] [place] [address] [phone]  [facility] EFAX  [address] [place], [place] [phone]  [facility]  [address] [place] [address] [phone]  [facility]  [address] [place] [address] [phone]  [facility]  [address], [place] [address] [phone]  [facility]  [address] [place], [place] [address] [phone]  [facility]  [address] [place] [address] [phone]    Situation ongoing, CSW to continue following and update chart as more information becomes available.   Harrie Sofia MSW, [place] 09/12/2024

## 2024-09-12 NOTE — ED Notes (Signed)
 Father called, pt states verbal for this rn to speak to father. Communicated pt will be going to [facility]. Pt is own guardian, father wants different facility in [place] explained there were no beds at [facility]. Pt aware of dc plan and agreeable to [facility]. Safe transport called for transportation.

## 2024-09-12 NOTE — ED Notes (Signed)
 At the time of discharge, the patient was alert and oriented 3, No current suicidal or homicidal ideation, plan, or intent was reported. Dc to [facility] via safe transport
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
## 2024-09-12 NOTE — Discharge Instructions (Addendum)
 Please transfer patient to [facility] for treatment and stabilization of mental status.

## 2024-09-12 NOTE — ED Triage Notes (Signed)
 Patient c/o rash on his head. Family  report his out of medication on his eczema.

## 2024-09-12 NOTE — ED Provider Notes (Signed)
 [place] EMERGENCY DEPARTMENT AT [facility] Provider Note   CSN: [id number] Arrival date & time: 09/12/24  0320     Patient presents with: Rash   Francisco Murray is a 28 y.o. male.   The history is provided by the patient.  Rash Location: scalp forehead and behind ears B. Quality: itchiness   Severity:  Moderate Onset quality:  Gradual Timing:  Constant Progression:  Unchanged Chronicity:  New Context: not hot tub use, not insect bite/sting and not medications   Relieved by:  Nothing Worsened by:  Nothing Associated symptoms: no fever, no induration and no joint pain        Prior to Admission medications   Medication Sig Start Date End Date Taking? Authorizing Provider  clobetasol ointment (TEMOVATE) 0.05 % Apply 1 Application topically 2 (two) times daily. 09/12/24  Yes Gracen Southwell, MD  hydrOXYzine  (ATARAX /VISTARIL ) 50 MG tablet Take 1 tablet (50 mg total) by mouth every 6 (six) hours as needed for anxiety. 04/12/18   Collene Gouge I, NP  risperiDONE  (RISPERDAL ) 1 MG tablet Take 1 tablet (1 mg) by mouth in the mornings & 2 tablets (2 mg) at bedtime: For mood control 04/12/18   Collene Gouge I, NP  traZODone  (DESYREL ) 50 MG tablet Take 1 tablet (50 mg total) by mouth at bedtime as needed for sleep. 04/12/18   Collene Gouge FERNS, NP    Allergies: Patient has no known allergies.    Review of Systems  Constitutional:  Negative for fever.  Eyes:  Negative for photophobia and visual disturbance.  Musculoskeletal:  Negative for arthralgias.  Skin:  Positive for rash.  All other systems reviewed and are negative.   Updated Vital Signs BP (!) 160/87   Pulse 88   Temp 97.6 F (36.4 C) (Oral)   Resp 16   SpO2 100%   Physical Exam Vitals and nursing note reviewed.  Constitutional:      General: He is not in acute distress.    Appearance: Normal appearance. He is well-developed. He is not diaphoretic.  HENT:     Head: Normocephalic and atraumatic.     Nose:  Nose normal.  Eyes:     Conjunctiva/sclera: Conjunctivae normal.     Pupils: Pupils are equal, round, and reactive to light.  Cardiovascular:     Rate and Rhythm: Normal rate and regular rhythm.  Pulmonary:     Effort: Pulmonary effort is normal.     Breath sounds: Normal breath sounds. No wheezing or rales.  Abdominal:     General: Bowel sounds are normal.     Palpations: Abdomen is soft.     Tenderness: There is no abdominal tenderness. There is no guarding or rebound.  Musculoskeletal:        General: Normal range of motion.     Cervical back: Normal range of motion and neck supple.  Skin:    General: Skin is warm and dry.     Capillary Refill: Capillary refill takes less than 2 seconds.      Neurological:     General: No focal deficit present.     Mental Status: He is alert and oriented to person, place, and time.     Deep Tendon Reflexes: Reflexes normal.  Psychiatric:        Mood and Affect: Mood normal.     (all labs ordered are listed, but only abnormal results are displayed) Labs Reviewed - No data to display  EKG: None  Radiology: No results found.  Procedures   Medications Ordered in the ED - No data to display                                  Medical Decision Making Patient with skin lesion and diagnosis of eczema who is not on home medication for same.  About to enter long term treatment setting in am   Amount and/or Complexity of Data Reviewed Independent Historian: parent    Details: See above  External Data Reviewed: notes.    Details: Previous notes reviewed   Risk Prescription drug management. Risk Details: Exam is consistent with eczema.  Clobetasol may be used on both scalp and face.  I have ordered this for outpatient management.  May also use Nizoral shampoo.  Stable for discharge with close follow up.      Final diagnoses:  Eczema, unspecified type    The patient is nontoxic-appearing on exam and vital signs are within normal  limits.  I have reviewed the triage vital signs and the nursing notes. Pertinent labs & imaging results that were available during my care of the patient were reviewed by me and considered in my medical decision making (see chart for details). After history, exam, and medical workup I feel the patient has been appropriately medically screened and is safe for discharge home. Pertinent diagnoses were discussed with the patient. Patient was given return precautions.  ED Discharge Orders          Ordered    clobetasol ointment (TEMOVATE) 0.05 %  2 times daily        09/12/24 0349               Itzael Liptak, MD 09/12/24 [phone]

## 2024-09-12 NOTE — ED Provider Notes (Cosign Needed Addendum)
 [facility]gent [facility] Continuous Assessment Admission H&P  Date: 09/12/24 Patient Name: Francisco Murray MRN: [id number] Chief Complaint: psychosis  Diagnoses:  Final diagnoses:  Paranoid schizophrenia (HCC)  GAD (generalized anxiety disorder)   HPI: Francisco Murray is a 28 y.o. male with a prior history of schizophrenia diagnosed in 2019 who presents to the [facility] accompanied by his father with worsening psychosis.  Review of psychiatry symptoms & patient assessment: On assessment, patient presents with paranoia, delusions of persecution, auditory hallucinations, is religiously preoccupied, and acting very erratically; he is disorganized, yelling at the top of his lungs, stating that he can hear God's voice talking to him directly, stating that Francisco Murray is out to harm him, states that Francisco Murray is a child molester, states that he has to get way to [employer], and makes a lot of other utterances.   Patient is difficult to direct, and difficult to engage in an assessment, rendering for some of the history to be obtained from his father; patient presents with tangentiality, rambles with flight of ideas, is illogical at times, insight, and judgment are currently poor, and he is a poor historian.  Due to patient's level of agitation during this encounter, it became necessary to administer a one-time dose of 2 mg Ativan , Benadryl  50 mg, and Haldol 5 mg of p.o. for agitation, as he progressively escalated and verbal aggression despite redirections and positive reinforcements being given to de-escalate.  Patient was able to take the medications with multiple positive verbal reinforcements.  Patient was given water to drink, and through the ice water on the floor, suspicious of staff offering him assistance,etc. patient was standing up, pacing back-and-forth in assessment room, pushed To the floor, yelling at the top of his lungs, difficult to redirect, warranting the  medications, due to risks of danger to staff, and other patients on the unit should behaviors continue to escalate.  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.  He shares that he was under the care of Dr. Akintayo for multiple years, and was stable on Risperdal  3 mg daily up until the past year, when they decided for patient to come off his medications as they felt that he no longer needed them.  Father shares that they took patient to a program in [place]  where he was for 90 days, and for the second month there, he was completely tapered off medications.  He shares that patient returned to the at home on September 19, but within the past week has began exhibiting some erratic behaviors which have worsened over the past few days.  Both patient and father report that patient had not slept well in the past 7 days, has been pacing restlessly back-and-forth in the home, talking to himself, has been preoccupied with Francisco Murray, amongst other delusions.  While speaking with patient's father, patient occasionally gets up, yells at the top of his lungs including low enforcement being out to get him, wanting to go to the [facility], Psychologist, counselling is unable to complete an assessment at this time, but patient shares that he resides at home with his parents, and there is sufficient psychosis to recommend an inpatient level of care to treat and stabilize mental status.  Patient denies substance use.  Last hospitalization at the [facility] was on 04/05/2018.  Current outpatient psychiatrist is Dr. Akintayo.  Recommendations: Due to the intensity of psychosis, we are recommending  inpatient hospitalization for treatment and stabilization of mental status.  Current mental status renders patient a danger to self and others, as thinking is impaired, and he currently has a distorted perception of reality, is hearing voices talking directly to him, states that  he can hear God's voice, presenting with delusions of persecution, thinking that people are out to harm him, and is therefore unable to function in the community and current state.  He is however denying SI/HI.  Total Time spent with patient: 1.5 hours  Musculoskeletal  Strength & Muscle Tone: within normal limits Gait & Station: normal Patient leans: Right  Psychiatric Specialty Exam  Presentation General Appearance: Disheveled  Eye Contact:Fair  Speech:Pressured  Speech Volume:Normal  Handedness:Right   Mood and Affect  Mood:Anxious  Affect:Congruent   Thought Process  Thought Processes:Disorganized  Descriptions of Associations:No data recorded Orientation:No data recorded Thought Content:Illogical    Hallucinations:Hallucinations: Auditory; Command  Ideas of Reference:Paranoia; Percusatory; Delusions  Suicidal Thoughts:Suicidal Thoughts: No  Homicidal Thoughts:Homicidal Thoughts: No   Sensorium  Memory:Recent Poor  Judgment:Poor  Insight:Poor   Executive Functions  Concentration:Poor  Attention Span:Poor  Recall:Poor  Fund of Knowledge:Poor  Language:Fair   Psychomotor Activity  Psychomotor Activity:Psychomotor Activity: Normal   Assets  Assets:Resilience   Sleep  Sleep:Sleep: Poor   No data recorded  Physical Exam Constitutional:      Appearance: Normal appearance.  Musculoskeletal:     Cervical back: Normal range of motion.  Neurological:     General: No focal deficit present.     Mental Status: He is alert and oriented to person, place, and time.    Review of Systems  Psychiatric/Behavioral:  Positive for hallucinations. Negative for depression, memory loss, substance abuse and suicidal ideas. The patient is nervous/anxious and has insomnia.     Blood pressure (!) 158/100, pulse 98, temperature 98.6 F (37 C), temperature source Oral, resp. rate 20, SpO2 99%. There is no height or weight on file to calculate  BMI.  Past Psychiatric History: Schizophrenia  Is the patient at risk to self? Yes  Has the patient been a risk to self in the past 6 months? Yes .    Has the patient been a risk to self within the distant past? Yes   Is the patient a risk to others? Yes   Has the patient been a risk to others in the past 6 months? No   Has the patient been a risk to others within the distant past? No   Past Medical History: Denies  Family History: Not provided  Social History: Not provided  Last Labs:  No visits with results within 6 Month(s) from this visit.  Latest known visit with results is:  Admission on 04/05/2018, Discharged on 04/12/2018  Component Date Value Ref Range Status   TSH 04/09/2018 1.351  0.350 - 4.500 uIU/mL Final   Comment: Performed by a 3rd Generation assay with a functional sensitivity of <=0.01 uIU/mL. Performed at [facility], [address], [place], [place] [address]    Prolactin 04/09/2018 51.6 (H)  4.0 - 15.2 ng/mL Final   Comment: (NOTE) Performed At: [facility] [address] [place], [place] [id number] Jennette Shorter MD Ey:[id number] Performed at [facility], [address], [place], [place] [address]    Hgb A1c MFr Bld 04/09/2018 4.8  4.8 - 5.6 % Final   Comment: (NOTE) Pre diabetes:          5.7%-6.4% Diabetes:              >  6.4% Glycemic control for   <7.0% adults with diabetes    Mean Plasma Glucose 04/09/2018 91.06  mg/dL Final   Performed at [facility], [address] [place], [place] [address]   Cholesterol 04/09/2018 97  0 - 200 mg/dL Final   Triglycerides 94/80/7980 50  <150 mg/dL Final   HDL 94/80/7980 37 (L)  >40 mg/dL Final   Total CHOL/HDL Ratio 04/09/2018 2.6  RATIO Final   VLDL 04/09/2018 10  0 - 40 mg/dL Final   LDL Cholesterol 04/09/2018 50  0 - 99 mg/dL Final   Comment:        Total Cholesterol/HDL:CHD Risk Coronary Heart Disease Risk Table                     Men    Women  1/2 Average Risk   3.4   3.3  Average Risk       5.0   4.4  2 X Average Risk   9.6   7.1  3 X Average Risk  23.4   11.0        Use the calculated Patient Ratio above and the CHD Risk Table to determine the patient's CHD Risk.        ATP III CLASSIFICATION (LDL):  <100     mg/dL   Optimal  899-870  mg/dL   Near or Above                    Optimal  130-159  mg/dL   Borderline  839-810  mg/dL   High  >809     mg/dL   Very High Performed at [facility] [address] [place], [place] [address]     Allergies: Patient has no known allergies.  Medications:  Facility Ordered Medications  Medication   [COMPLETED] LORazepam  (ATIVAN ) tablet 2 mg   [COMPLETED] haloperidol (HALDOL) tablet 5 mg   [COMPLETED] diphenhydrAMINE  (BENADRYL ) capsule 50 mg   acetaminophen  (TYLENOL ) tablet 650 mg   alum & mag hydroxide-simeth (MAALOX/MYLANTA) 200-200-20 MG/5ML suspension 30 mL   magnesium  hydroxide (MILK OF MAGNESIA) suspension 30 mL   haloperidol (HALDOL) tablet 5 mg   And   diphenhydrAMINE  (BENADRYL ) capsule 50 mg   haloperidol lactate (HALDOL) injection 5 mg   And   diphenhydrAMINE  (BENADRYL ) injection 50 mg   And   LORazepam  (ATIVAN ) injection 2 mg   haloperidol lactate (HALDOL) injection 10 mg   And   diphenhydrAMINE  (BENADRYL ) injection 50 mg   And   LORazepam  (ATIVAN ) injection 2 mg   hydrOXYzine  (ATARAX ) tablet 25 mg   traZODone  (DESYREL ) tablet 50 mg   risperiDONE  (RISPERDAL  M-TABS) disintegrating tablet 1 mg   benztropine (COGENTIN) tablet 0.5 mg    Medical Decision Making  -Recommended for inpatient behavioral health hospitalization for treatment and stabilization of mental status -Risperdal  1 mg twice daily ordered for psychosis/mood stabilization -Hydroxyzine  25 mg 3 times daily as needed for anxiety -Agitation protocol medications: Ativan  2 mg/Benadryl  50 mg/Haldol 5 mg as needed 3 times daily for agitation -Baseline labs: CMP, CBC, TSH,  lipid panel, hemoglobin A1c, EKG, toxicology screen, ordered.   Recommendations  Based on my evaluation the patient appears to have an emergency mental health condition for which I recommend the patient be transferred to an inpatient behavioral health unit for treatment and stabilization.   [name], NP 09/12/24  1:24 PM

## 2024-09-12 NOTE — Progress Notes (Signed)
 Pt has been accepted to [facility] on 09/12/2024  Bed assignment: Main campus  Pt meets inpatient criteria per [name], NP   Attending Physician will be Millie Manners, MD  Report can be called to: [phone] (this is a pager, please leave call-back number when giving report)  Pt can arrive after ASAP  Care Team Notified: Comfort Reinhold, RN, [name], NP
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
## 2024-09-12 NOTE — ED Notes (Signed)
 Patient Denies suicidal or homicidal ideations. Endorses auditory command hallucinations. States feeling on edge and appears worried. Patient observed disheveled in appearance with fair eye contact. Speech pressured but with normal volume. Mood anxious and affect congruent. Thought process noted to be disorganized with illogical content and presence of paranoia, persecutory delusions, and command auditory hallucinations. Memory, concentration, attention span, recall, judgment, and insight all assessed as poor. Patient alert and oriented 3. Gait steady; no focal neurological deficits noted. Skin intact, report eczema  No acute safety concerns at this time as patient denies SI/HI. Appears internally preoccupied and somewhat guarded but cooperative with care at this time.pt oriented to unit, offered fluids. Environment secured, poc ongoing

## 2024-09-29 ENCOUNTER — Other Ambulatory Visit: Payer: Self-pay

## 2024-09-29 ENCOUNTER — Encounter (HOSPITAL_COMMUNITY): Payer: Self-pay | Admitting: *Deleted

## 2024-09-29 ENCOUNTER — Emergency Department (HOSPITAL_COMMUNITY)
Admission: EM | Admit: 2024-09-29 | Discharge: 2024-09-29 | Disposition: A | Discharge status: Home / Self Care | Attending: Emergency Medicine | Admitting: Emergency Medicine

## 2024-09-29 ENCOUNTER — Emergency Department (HOSPITAL_COMMUNITY)

## 2024-09-29 DIAGNOSIS — W453XXA Fishing hook entering through skin, initial encounter: Secondary | ICD-10-CM | POA: Diagnosis not present

## 2024-09-29 DIAGNOSIS — S61542A Puncture wound with foreign body of left wrist, initial encounter: Secondary | ICD-10-CM | POA: Insufficient documentation

## 2024-09-29 DIAGNOSIS — Z23 Encounter for immunization: Secondary | ICD-10-CM | POA: Insufficient documentation

## 2024-09-29 DIAGNOSIS — Y92410 Unspecified street and highway as the place of occurrence of the external cause: Secondary | ICD-10-CM | POA: Insufficient documentation

## 2024-09-29 DIAGNOSIS — S6990XA Unspecified injury of unspecified wrist, hand and finger(s), initial encounter: Secondary | ICD-10-CM

## 2024-09-29 MED ORDER — TETANUS-DIPHTH-ACELL PERTUSSIS 5-2-15.5 LF-MCG/0.5 IM SUSP
0.5000 mL | Freq: Once | INTRAMUSCULAR | Status: AC
  Administered 2024-09-29: 0.5 mL via INTRAMUSCULAR
  Filled 2024-09-29: qty 0.5

## 2024-09-29 MED ORDER — LIDOCAINE-EPINEPHRINE (PF) 2 %-1:200000 IJ SOLN
10.0000 mL | Freq: Once | INTRAMUSCULAR | Status: AC
  Administered 2024-09-29: 10 mL
  Filled 2024-09-29: qty 20

## 2024-09-29 MED ORDER — OXYCODONE HCL 5 MG PO TABS
5.0000 mg | ORAL_TABLET | Freq: Once | ORAL | Status: AC
  Administered 2024-09-29: 5 mg via ORAL
  Filled 2024-09-29: qty 1

## 2024-09-29 NOTE — ED Provider Notes (Signed)
 [place] EMERGENCY DEPARTMENT AT [facility] Provider Note  MDM   HPI/ROS:  Francisco Murray is a 28 y.o. male with a PMH of schizophrenia who presents with a fishhook injury.  Patient reports that he picked up a random fishing hook in the middle of the road, and it is stuck in his left inner wrist.  On my initial evaluation, patient is:  -Vital signs stable. Patient afebrile, hemodynamically stable, and non-toxic appearing.  Physical exam is notable for: - Fishhook stuck in his left medial wrist     Foreign body removal by pushing fishhook through, cutting off the tip and removing.  See the procedure note.  This was followed by through a washout with 1 L NS, x-ray which showed no retained foreign bodies.  After removal, patient still NVIT, ROM intact, no sensory deficits, patient given Tdap and discharged with strict return precautions.  Interpretations, interventions, and the patient's course of care are documented below.    Disposition:  I discussed the plan for discharge with the patient and/or their surrogate at bedside prior to discharge and they were in agreement with the plan and verbalized understanding of the return precautions provided. All questions answered to the best of my ability. Ultimately, the patient was discharged in stable condition with stable vital signs. I am reassured that they are capable of close follow up and good social support at home.   Clinical Impression: No diagnosis found.  Rx / DC Orders ED Discharge Orders     None      Clinical Complexity A medically appropriate history, review of systems, and physical exam was performed.  My independent interpretations of EKG, labs, and radiology are documented in the ED course above.   If decision rules were used in this patient's evaluation, they are listed below.   Click here for ABCD2, HEART and other calculatorsREFRESH Note before signing   Patient's presentation is most consistent with  acute complicated illness / injury requiring diagnostic workup.  Medical Decision Making Amount and/or Complexity of Data Reviewed Radiology: ordered.  Risk Prescription drug management.    HPI/ROS      See MDM section for pertinent HPI and ROS. A complete ROS was performed with pertinent positives/negatives noted above.   Past Medical History:  Diagnosis Date   Schizophrenia [facility])     History reviewed. No pertinent surgical history.    Physical Exam   Vitals:   09/29/24 1640 09/29/24 1646  BP: (!) 159/93   Pulse: 77   Resp: (!) 22   Temp: (!) 97.4 F (36.3 C)   SpO2: 96%   Weight:  81.6 kg  Height:  6' 2 (1.88 m)    Physical Exam Vitals and nursing note reviewed.  Constitutional:      General: He is not in acute distress.    Appearance: He is well-developed.  HENT:     Head: Normocephalic and atraumatic.  Cardiovascular:     Rate and Rhythm: Normal rate and regular rhythm.  Musculoskeletal:        General: No swelling.     Cervical back: Neck supple.  Skin:    Comments: Fishhook in left medial wrist with 1 puncture  Neurological:     Mental Status: He is alert.      Procedures   If procedures were preformed on this patient, they are listed below:  .Foreign Body Removal  Date/Time: 09/29/2024 11:17 PM  Performed by: Billy Pal, MD Authorized by: Charlyn Sora, MD  Consent:  Verbal consent obtained Risks and benefits: risks, benefits and alternatives were discussed Consent given by: patient Patient understanding: patient states understanding of the procedure being performed Patient consent: the patient's understanding of the procedure matches consent given Patient identity confirmed: verbally with patient Time out: Immediately prior to procedure a time out was called to verify the correct patient, procedure, equipment, support staff and site/side marked as required. Body area: skin General location: upper extremity Location details: left  wrist Anesthesia: local infiltration  Anesthesia: Local Anesthetic: lidocaine 1% without epinephrine Anesthetic total: 7 mL  Sedation: Patient sedated: no  Patient restrained: no Patient cooperative: yes Localization method: visualized Removal mechanism: hemostat Dressing: dressing applied Tendon involvement: none Depth: subcutaneous Complexity: simple 1 objects recovered. Objects recovered: fish hook Post-procedure assessment: foreign body removed Patient tolerance: patient tolerated the procedure well with no immediate complications    Please note that this documentation was produced with the assistance of voice-to-text technology and may contain errors.     Billy Pal, MD 09/29/24 7680    Charlyn Sora, MD 09/30/24 1549

## 2024-09-29 NOTE — Discharge Instructions (Signed)
 Francisco Murray:  Thank you for allowing us  to take care of you today.  We hope you begin feeling better soon. You were seen today for fishhook injury.  While you are here performed a physical examination, removed the fishhook and then did a thorough washout as well as x-ray imaging which did not show any retained foreign body.  You have also been given a Tdap and are now up-to-date.  To-Do:  Please follow-up with your primary doctor within the next 2-3 days. It is important that you review any labs or imaging results (if any) that you had today with them. Your preliminary imaging results (if any) are attached. Please return to the Emergency Department or call 911 if you experience chest pain, shortness of breath, severe pain, severe fever, altered mental status, or have any reason to think that you need emergency medical care.  Thank you again.  Hope you feel better soon.  Department of Emergency Medicine

## 2024-09-29 NOTE — ED Triage Notes (Signed)
 Pt reports he picked up a random fishing hook in the middle of the road and it is stuck in his left inner wrist.
